# Patient Record
Sex: Male | Born: 1950 | Race: White | Hispanic: No | Smoking: Former smoker
Health system: Southern US, Community
[De-identification: ages and names within clinical notes are randomized; demographics above are authoritative.]

## PROBLEM LIST (undated history)

## (undated) DIAGNOSIS — E079 Disorder of thyroid, unspecified: Secondary | ICD-10-CM

## (undated) DIAGNOSIS — E785 Hyperlipidemia, unspecified: Secondary | ICD-10-CM

## (undated) DIAGNOSIS — C44300 Unspecified malignant neoplasm of skin of unspecified part of face: Secondary | ICD-10-CM

## (undated) DIAGNOSIS — Z86010 Personal history of colonic polyps: Secondary | ICD-10-CM

## (undated) HISTORY — PX: COLONOSCOPY: SHX174

## (undated) HISTORY — DX: Personal history of colonic polyps: Z86.010

## (undated) HISTORY — DX: Unspecified malignant neoplasm of skin of unspecified part of face: C44.300

## (undated) HISTORY — PX: OTHER SURGICAL HISTORY: SHX169

## (undated) HISTORY — DX: Disorder of thyroid, unspecified: E07.9

## (undated) HISTORY — PX: POLYPECTOMY: SHX149

## (undated) HISTORY — DX: Hyperlipidemia, unspecified: E78.5

## (undated) HISTORY — PX: HYDROCELE EXCISION / REPAIR: SUR1145

---

## 2008-06-12 ENCOUNTER — Ambulatory Visit: Payer: Self-pay | Admitting: Internal Medicine

## 2008-06-25 ENCOUNTER — Telehealth: Payer: Self-pay | Admitting: Internal Medicine

## 2008-06-26 ENCOUNTER — Ambulatory Visit: Payer: Self-pay | Admitting: Internal Medicine

## 2008-06-26 ENCOUNTER — Encounter: Payer: Self-pay | Admitting: Internal Medicine

## 2008-06-26 DIAGNOSIS — Z860100 Personal history of colon polyps, unspecified: Secondary | ICD-10-CM

## 2008-06-26 HISTORY — DX: Personal history of colonic polyps: Z86.010

## 2008-06-26 HISTORY — DX: Personal history of colon polyps, unspecified: Z86.0100

## 2008-07-03 ENCOUNTER — Encounter: Payer: Self-pay | Admitting: Internal Medicine

## 2011-09-28 ENCOUNTER — Other Ambulatory Visit: Payer: Self-pay | Admitting: Internal Medicine

## 2011-10-03 ENCOUNTER — Telehealth: Payer: Self-pay

## 2011-10-03 NOTE — Telephone Encounter (Signed)
PATIENT STATES WE GAVE HIM A 30 DAY SUPPLY  OF SYNTHROID AND HE DOES NOT KNOW WHY. HE SAID IT WAS PRESCRIBED BY RYAN DUNN AND HE DOES NOT EVEN KNOW RYAN. HE USUALLY GETS A 90 DAY SUPPLY FROM [facility] AND THAT HE HAS PLENTY. HE SAID HE HAS NOT EVEN HAD A PHYSICAL. BEST PHONE [phone]  (CELL)   MBC

## 2011-10-04 NOTE — Telephone Encounter (Signed)
LMOM that if he didn't need refill then dont get it filled.

## 2011-10-04 NOTE — Telephone Encounter (Signed)
Ryan, do you know anything about this prescription?

## 2011-10-04 NOTE — Telephone Encounter (Signed)
We received a refill request, so I refilled it. If he does not need it then he does not need to fill it.

## 2011-11-30 ENCOUNTER — Ambulatory Visit (INDEPENDENT_AMBULATORY_CARE_PROVIDER_SITE_OTHER): Payer: 59 | Admitting: Family Medicine

## 2011-11-30 ENCOUNTER — Encounter: Payer: Self-pay | Admitting: Family Medicine

## 2011-11-30 VITALS — BP 124/74 | HR 56 | Temp 98.2°F | Resp 16 | Ht 67.5 in | Wt 185.8 lb

## 2011-11-30 DIAGNOSIS — R9431 Abnormal electrocardiogram [ECG] [EKG]: Secondary | ICD-10-CM

## 2011-11-30 DIAGNOSIS — Z23 Encounter for immunization: Secondary | ICD-10-CM

## 2011-11-30 DIAGNOSIS — E039 Hypothyroidism, unspecified: Secondary | ICD-10-CM

## 2011-11-30 DIAGNOSIS — E663 Overweight: Secondary | ICD-10-CM

## 2011-11-30 DIAGNOSIS — I4510 Unspecified right bundle-branch block: Secondary | ICD-10-CM

## 2011-11-30 DIAGNOSIS — E785 Hyperlipidemia, unspecified: Secondary | ICD-10-CM

## 2011-11-30 DIAGNOSIS — Z87891 Personal history of nicotine dependence: Secondary | ICD-10-CM

## 2011-11-30 DIAGNOSIS — Z0000 Encounter for general adult medical examination without abnormal findings: Secondary | ICD-10-CM

## 2011-11-30 DIAGNOSIS — N400 Enlarged prostate without lower urinary tract symptoms: Secondary | ICD-10-CM

## 2011-11-30 LAB — POCT URINALYSIS DIPSTICK
Glucose, UA: NEGATIVE
Ketones, UA: NEGATIVE
Leukocytes, UA: NEGATIVE
Spec Grav, UA: 1.02

## 2011-11-30 MED ORDER — LEVOTHYROXINE SODIUM 100 MCG PO TABS: 100.0000 ug | ORAL_TABLET | Freq: Every day | ORAL | Status: DC

## 2011-11-30 MED ORDER — ATORVASTATIN CALCIUM 10 MG PO TABS: 10.0000 mg | ORAL_TABLET | Freq: Every day | ORAL | Status: DC

## 2011-11-30 NOTE — Patient Instructions (Signed)
Keeping you healthy  Get these tests  Blood pressure- Have your blood pressure checked once a year by your healthcare provider.  Normal blood pressure is 120/80  Weight- Have your body mass index (BMI) calculated to screen for obesity.  BMI is a measure of body fat based on height and weight. You can also calculate your own BMI at [email or web address].  Cholesterol- Have your cholesterol checked every year.  Diabetes- Have your blood sugar checked regularly if you have high blood pressure, high cholesterol, have a family history of diabetes or if you are overweight.  Screening for Colon Cancer- Colonoscopy starting at age 21.  Screening may begin sooner depending on your family history and other health conditions. Follow up colonoscopy as directed by your Gastroenterologist.  Screening for Prostate Cancer- Both blood work (PSA) and a rectal exam help screen for Prostate Cancer.  Screening begins at age 24 with African-American men and at age 87 with Caucasian men.  Screening may begin sooner depending on your family history.  Take these medicines  Aspirin- One aspirin daily can help prevent Heart disease and Stroke.  Flu shot- Every fall.  Tetanus- Every 10 years.  Tdap was given today.  Zostavax- Once after the age of 44 to prevent Shingles. You have a prescription for this, given to you last year.  Pneumonia shot- Once after the age of 72; if you are younger than 52, ask your healthcare provider if you need a Pneumonia shot. You received this vaccine in 2011.  Take these steps  Don't smoke- If you do smoke, talk to your doctor about quitting.  For tips on how to quit, go to www.smokefree.gov or call 1-800-QUIT-NOW.  Be physically active- Exercise 5 days a week for at least 30 minutes.  If you are not already physically active start slow and gradually work up to 30 minutes of moderate physical activity.  Examples of moderate activity include walking briskly, mowing the yard,  dancing, swimming, bicycling, etc.  Eat a healthy diet- Eat a variety of healthy food such as fruits, vegetables, low fat milk, low fat cheese, yogurt, lean meant, poultry, fish, beans, tofu, etc. For more information go to www.thenutritionsource.org  Drink alcohol in moderation- Limit alcohol intake to less than two drinks a day. Never drink and drive.  Dentist- Brush and floss twice daily; visit your dentist twice a year.  Depression- Your emotional health is as important as your physical health. If you're feeling down, or losing interest in things you would normally enjoy please talk to your healthcare provider.  Eye exam- Visit your eye doctor every year.  Safe sex- If you may be exposed to a sexually transmitted infection, use a condom.  Seat belts- Seat belts can save your life; always wear one.  Smoke/Carbon Monoxide detectors- These detectors need to be installed on the appropriate level of your home.  Replace batteries at least once a year.  Skin cancer- When out in the sun, cover up and use sunscreen 15 SPF or higher.  Violence- If anyone is threatening you, please tell your healthcare provider.  Living Will/ Health care power of attorney- Speak with your healthcare provider and family.   Hypertriglyceridemia  Diet for High blood levels of Triglycerides Most fats in food are triglycerides. Triglycerides in your blood are stored as fat in your body. High levels of triglycerides in your blood may put you at a greater risk for heart disease and stroke.  Normal triglyceride levels are less than 150 mg/dL. Borderline  high levels are 150-199 mg/dl. High levels are 200 - 499 mg/dL, and very high triglyceride levels are greater than 500 mg/dL. The decision to treat high triglycerides is generally based on the level. For people with borderline or high triglyceride levels, treatment includes weight loss and exercise. Drugs are recommended for people with very high triglyceride  levels. Many people who need treatment for high triglyceride levels have metabolic syndrome. This syndrome is a collection of disorders that often include: insulin resistance, high blood pressure, blood clotting problems, high cholesterol and triglycerides. TESTING PROCEDURE FOR TRIGLYCERIDES  You should not eat 4 hours before getting your triglycerides measured. The normal range of triglycerides is between 10 and 250 milligrams per deciliter (mg/dl). Some people may have extreme levels (1000 or above), but your triglyceride level may be too high if it is above 150 mg/dl, depending on what other risk factors you have for heart disease.   People with high blood triglycerides may also have high blood cholesterol levels. If you have high blood cholesterol as well as high blood triglycerides, your risk for heart disease is probably greater than if you only had high triglycerides. High blood cholesterol is one of the main risk factors for heart disease.  CHANGING YOUR DIET  Your weight can affect your blood triglyceride level. If you are more than 20% above your ideal body weight, you may be able to lower your blood triglycerides by losing weight. Eating less and exercising regularly is the best way to combat this. Fat provides more calories than any other food. The best way to lose weight is to eat less fat. Only 30% of your total calories should come from fat. Less than 7% of your diet should come from saturated fat. A diet low in fat and saturated fat is the same as a diet to decrease blood cholesterol. By eating a diet lower in fat, you may lose weight, lower your blood cholesterol, and lower your blood triglyceride level.  Eating a diet low in fat, especially saturated fat, may also help you lower your blood triglyceride level. Ask your dietitian to help you figure how much fat you can eat based on the number of calories your caregiver has prescribed for you.  Exercise, in addition to helping with weight  loss may also help lower triglyceride levels.   Alcohol can increase blood triglycerides. You may need to stop drinking alcoholic beverages.   Too much carbohydrate in your diet may also increase your blood triglycerides. Some complex carbohydrates are necessary in your diet. These may include bread, rice, potatoes, other starchy vegetables and cereals.   Reduce "simple" carbohydrates. These may include pure sugars, candy, honey, and jelly without losing other nutrients. If you have the kind of high blood triglycerides that is affected by the amount of carbohydrates in your diet, you will need to eat less sugar and less high-sugar foods. Your caregiver can help you with this.   Adding 2-4 grams of fish oil (EPA+ DHA) may also help lower triglycerides. Speak with your caregiver before adding any supplements to your regimen.  Following the Diet  Maintain your ideal weight. Your caregivers can help you with a diet. Generally, eating less food and getting more exercise will help you lose weight. Joining a weight control group may also help. Ask your caregivers for a good weight control group in your area.  Eat low-fat foods instead of high-fat foods. This can help you lose weight too.  These foods are lower in fat.  Eat MORE of these:   Dried beans, peas, and lentils.   Egg whites.   Low-fat cottage cheese.   Fish.   Lean cuts of meat, such as round, sirloin, rump, and flank (cut extra fat off meat you fix).   Whole grain breads, cereals and pasta.   Skim and nonfat dry milk.   Low-fat yogurt.   Poultry without the skin.   Cheese made with skim or part-skim milk, such as mozzarella, parmesan, farmers', ricotta, or pot cheese.  These are higher fat foods. Eat LESS of these:   Whole milk and foods made from whole milk, such as American, blue, cheddar, monterey jack, and swiss cheese   High-fat meats, such as luncheon meats, sausages, knockwurst, bratwurst, hot dogs, ribs, corned beef,  ground pork, and regular ground beef.   Fried foods.  Limit saturated fats in your diet. Substituting unsaturated fat for saturated fat may decrease your blood triglyceride level. You will need to read package labels to know which products contain saturated fats.  These foods are high in saturated fat. Eat LESS of these:   Fried pork skins.   Whole milk.   Skin and fat from poultry.   Palm oil.   Butter.   Shortening.   Cream cheese.   Tomasa Blase.   Margarines and baked goods made from listed oils.   Vegetable shortenings.   Chitterlings.   Fat from meats.   Coconut oil.   Palm kernel oil.   Lard.   Cream.   Sour cream.   Fatback.   Coffee whiteners and non-dairy creamers made with these oils.   Cheese made from whole milk.  Use unsaturated fats (both polyunsaturated and monounsaturated) moderately. Remember, even though unsaturated fats are better than saturated fats; you still want a diet low in total fat.  These foods are high in unsaturated fat:   Canola oil.   Sunflower oil.   Mayonnaise.   Almonds.   Peanuts.   Pine nuts.   Margarines made with these oils.   Safflower oil.   Olive oil.   Avocados.   Cashews.   Peanut butter.   Sunflower seeds.   Soybean oil.   Peanut oil.   Olives.   Pecans.   Walnuts.   Pumpkin seeds.  Avoid sugar and other high-sugar foods. This will decrease carbohydrates without decreasing other nutrients. Sugar in your food goes rapidly to your blood. When there is excess sugar in your blood, your liver may use it to make more triglycerides. Sugar also contains calories without other important nutrients.  Eat LESS of these:   Sugar, brown sugar, powdered sugar, jam, jelly, preserves, honey, syrup, molasses, pies, candy, cakes, cookies, frosting, pastries, colas, soft drinks, punches, fruit drinks, and regular gelatin.   Avoid alcohol. Alcohol, even more than sugar, may increase blood triglycerides. In  addition, alcohol is high in calories and low in nutrients. Ask for sparkling water, or a diet soft drink instead of an alcoholic beverage.  Suggestions for planning and preparing meals   Bake, broil, grill or roast meats instead of frying.   Remove fat from meats and skin from poultry before cooking.   Add spices, herbs, lemon juice or vinegar to vegetables instead of salt, rich sauces or gravies.   Use a non-stick skillet without fat or use no-stick sprays.   Cool and refrigerate stews and broth. Then remove the hardened fat floating on the surface before serving.   Refrigerate meat drippings and skim off fat to  make low-fat gravies.   Serve more fish.   Use less butter, margarine and other high-fat spreads on bread or vegetables.   Use skim or reconstituted non-fat dry milk for cooking.   Cook with low-fat cheeses.   Substitute low-fat yogurt or cottage cheese for all or part of the sour cream in recipes for sauces, dips or congealed salads.   Use half yogurt/half mayonnaise in salad recipes.   Substitute evaporated skim milk for cream. Evaporated skim milk or reconstituted non-fat dry milk can be whipped and substituted for whipped cream in certain recipes.   Choose fresh fruits for dessert instead of high-fat foods such as pies or cakes. Fruits are naturally low in fat.  When Dining Out   Order low-fat appetizers such as fruit or vegetable juice, pasta with vegetables or tomato sauce.   Select clear, rather than cream soups.   Ask that dressings and gravies be served on the side. Then use less of them.   Order foods that are baked, broiled, poached, steamed, stir-fried, or roasted.   Ask for margarine instead of butter, and use only a small amount.   Drink sparkling water, unsweetened tea or coffee, or diet soft drinks instead of alcohol or other sweet beverages.  QUESTIONS AND ANSWERS ABOUT OTHER FATS IN THE BLOOD: SATURATED FAT, TRANS FAT, AND CHOLESTEROL What is  trans fat? Trans fat is a type of fat that is formed when vegetable oil is hardened through a process called hydrogenation. This process helps makes foods more solid, gives them shape, and prolongs their shelf life. Trans fats are also called hydrogenated or partially hydrogenated oils.  What do saturated fat, trans fat, and cholesterol in foods have to do with heart disease? Saturated fat, trans fat, and cholesterol in the diet all raise the level of LDL "bad" cholesterol in the blood. The higher the LDL cholesterol, the greater the risk for coronary heart disease (CHD). Saturated fat and trans fat raise LDL similarly.  What foods contain saturated fat, trans fat, and cholesterol? High amounts of saturated fat are found in animal products, such as fatty cuts of meat, chicken skin, and full-fat dairy products like butter, whole milk, cream, and cheese, and in tropical vegetable oils such as palm, palm kernel, and coconut oil. Trans fat is found in some of the same foods as saturated fat, such as vegetable shortening, some margarines (especially hard or stick margarine), crackers, cookies, baked goods, fried foods, salad dressings, and other processed foods made with partially hydrogenated vegetable oils. Small amounts of trans fat also occur naturally in some animal products, such as milk products, beef, and lamb. Foods high in cholesterol include liver, other organ meats, egg yolks, shrimp, and full-fat dairy products. How can I use the new food label to make heart-healthy food choices? Check the Nutrition Facts panel of the food label. Choose foods lower in saturated fat, trans fat, and cholesterol. For saturated fat and cholesterol, you can also use the Percent Daily Value (%DV): 5% DV or less is low, and 20% DV or more is high. (There is no %DV for trans fat.) Use the Nutrition Facts panel to choose foods low in saturated fat and cholesterol, and if the trans fat is not listed, read the ingredients and  limit products that list shortening or hydrogenated or partially hydrogenated vegetable oil, which tend to be high in trans fat. POINTS TO REMEMBER: YOU NEED A LITTLE TLC (THERAPEUTIC LIFESTYLE CHANGES)  Discuss your risk for heart disease with  your caregivers, and take steps to reduce risk factors.   Change your diet. Choose foods that are low in saturated fat, trans fat, and cholesterol.   Add exercise to your daily routine if it is not already being done. Participate in physical activity of moderate intensity, like brisk walking, for at least 30 minutes on most, and preferably all days of the week. No time? Break the 30 minutes into three, 10-minute segments during the day.   Stop smoking. If you do smoke, contact your caregiver to discuss ways in which they can help you quit.   Do not use street drugs.   Maintain a normal weight.   Maintain a healthy blood pressure.   Keep up with your blood work for checking the fats in your blood as directed by your caregiver.  Document Released: 02/10/2004 Document Revised: 04/13/2011 Document Reviewed: 09/07/2008 [facility] Patient Information 2012 [place], [place].

## 2011-11-30 NOTE — Progress Notes (Deleted)
  Subjective:    Patient ID: Joshua Moreno, male    DOB: Aug 03, 1950, 61 y.o.   MRN: [id number]  HPI    Review of Systems  Constitutional: Negative.   Eyes: Negative.   Cardiovascular: Negative.   Gastrointestinal: Negative.   Genitourinary: Negative.   Musculoskeletal: Negative.   Skin: Negative.   Neurological: Negative.   Hematological: Negative.   Psychiatric/Behavioral: Negative.        Objective:   Physical Exam        Assessment & Plan:

## 2011-11-30 NOTE — Progress Notes (Signed)
Subjective:    Patient ID: Joshua Moreno, male    DOB: 1950/09/10, 61 y.o.   MRN: [id number]  HPI This 61 y.o. Male is here for CPE. He has Hypothyroidism diagnosed in 2004 and Lipid disorder  with high HDL and LDL. He has had elevated PSA in past with evaluation with Dr. Annabell Howells; the elevated  PSA was thought to be lab error and repeat value was normal (2010  His last PSA = 1.68 was checked in July 2012    He works in [employer] and is a nonsmoker. He does consume some alcohol 2-3x/week.Exercise: Tennis  twice a week.   Last Colonoscopy: Feb 2011   Review of Systems  Constitutional: Negative.   Eyes: Negative.   Respiratory: Negative.   Cardiovascular: Negative.   Gastrointestinal: Negative.   Genitourinary: Negative.   Musculoskeletal: Negative.   Skin: Negative.   Hematological: Negative.   Psychiatric/Behavioral: Negative.        Objective:   Physical Exam  Nursing note and vitals reviewed. Constitutional: He is oriented to person, place, and time. He appears well-developed and well-nourished. No distress.  HENT:  Head: Normocephalic and atraumatic.  Right Ear: Hearing, tympanic membrane, external ear and ear canal normal.  Left Ear: Hearing, tympanic membrane, external ear and ear canal normal.  Nose: Nose normal. No mucosal edema, nasal deformity or septal deviation.  Mouth/Throat: Uvula is midline, oropharynx is clear and moist and mucous membranes are normal. No oral lesions. Normal dentition. No dental caries.  Eyes: Conjunctivae and EOM are normal. Pupils are equal, round, and reactive to light. No scleral icterus.  Neck: Normal range of motion. Neck supple. No thyromegaly present.  Cardiovascular: Normal rate, regular rhythm, normal heart sounds and intact distal pulses.  Exam reveals no gallop and no friction rub.   No murmur heard. Pulmonary/Chest: Effort normal and breath sounds normal. No respiratory distress. He has no wheezes.       Chest size increased , slight  barrel shape- breath sounds distant at bases  Abdominal: Soft. Normal appearance and bowel sounds are normal. He exhibits no abdominal bruit, no pulsatile midline mass and no mass. There is no hepatosplenomegaly. There is no tenderness. There is no guarding and no CVA tenderness. No hernia. Hernia confirmed negative in the right inguinal area and confirmed negative in the left inguinal area.  Genitourinary: Prostate normal, testes normal and penis normal. Rectal exam shows external hemorrhoid. Rectal exam shows no fissure, no mass, no tenderness and anal tone normal. Guaiac negative stool. Prostate is not tender. Right testis shows no mass, no swelling and no tenderness. Right testis is descended. Left testis shows no mass, no swelling and no tenderness. Left testis is descended.       Prostate firm with slightly enlarged right lobe but no discrete nodules  Musculoskeletal: Normal range of motion. He exhibits no edema and no tenderness.  Lymphadenopathy:    He has no cervical adenopathy.       Right: No inguinal adenopathy present.       Left: No inguinal adenopathy present.  Neurological: He is alert and oriented to person, place, and time. No cranial nerve deficit. He exhibits normal muscle tone. Coordination normal.       DTRs diminished- 1+  Skin: Skin is warm and dry. No erythema. No pallor.  Psychiatric: He has a normal mood and affect. His behavior is normal. Judgment and thought content normal.          Assessment & Plan:  1. Routine general medical examination at a health care facility  PSA, IFOBT POC (occult bld, rslt in office), POCT urinalysis dipstick  2. Hyperlipidemia  Lipid panel, Comprehensive metabolic panel RF: Atorvastatin 10 mg  #90  3 RFs  3. Hypothyroidism  TSH RF: Levothyroxine  100 mcg  #90  3 RFs  4. Abnormal resting ECG findings - ETT Cardiolyte- 2004- normal , EF 60% ECG: 2011 and 2012 - Sinus bradycardia and RBBB Pt asymptomatic  5. Need for prophylactic  vaccination with combined diphtheria-tetanus-pertussis (DTP) vaccine  Tdap vaccine greater than or equal to 7yo IM  6. RBBB -chronic and stable

## 2011-12-01 LAB — COMPREHENSIVE METABOLIC PANEL
ALT: 38 U/L (ref 0–53)
CO2: 27 mEq/L (ref 19–32)
Calcium: 9.6 mg/dL (ref 8.4–10.5)
Chloride: 105 mEq/L (ref 96–112)
Glucose, Bld: 74 mg/dL (ref 70–99)
Sodium: 142 mEq/L (ref 135–145)
Total Bilirubin: 1.1 mg/dL (ref 0.3–1.2)
Total Protein: 6.8 g/dL (ref 6.0–8.3)

## 2011-12-01 LAB — TSH: TSH: 1.738 u[IU]/mL (ref 0.350–4.500)

## 2011-12-01 LAB — LIPID PANEL
HDL: 57 mg/dL (ref >39)
LDL Cholesterol: 99 mg/dL (ref 0–99)
Triglycerides: 57 mg/dL (ref <150)

## 2011-12-01 LAB — PSA: PSA: 1.2 ng/mL (ref <=4.00)

## 2011-12-04 ENCOUNTER — Encounter: Payer: Self-pay | Admitting: Family Medicine

## 2011-12-04 DIAGNOSIS — N400 Enlarged prostate without lower urinary tract symptoms: Secondary | ICD-10-CM | POA: Insufficient documentation

## 2011-12-04 DIAGNOSIS — I4510 Unspecified right bundle-branch block: Secondary | ICD-10-CM | POA: Insufficient documentation

## 2011-12-04 DIAGNOSIS — E663 Overweight: Secondary | ICD-10-CM | POA: Insufficient documentation

## 2011-12-04 DIAGNOSIS — E039 Hypothyroidism, unspecified: Secondary | ICD-10-CM | POA: Insufficient documentation

## 2011-12-04 DIAGNOSIS — Z87891 Personal history of nicotine dependence: Secondary | ICD-10-CM | POA: Insufficient documentation

## 2011-12-04 DIAGNOSIS — E785 Hyperlipidemia, unspecified: Secondary | ICD-10-CM | POA: Insufficient documentation

## 2011-12-05 ENCOUNTER — Encounter: Payer: Self-pay | Admitting: Family Medicine

## 2011-12-05 NOTE — Progress Notes (Signed)
Quick Note:  Please notify pt that results are normal.   Provide pt with copy of labs. ______ 

## 2012-12-17 ENCOUNTER — Ambulatory Visit (INDEPENDENT_AMBULATORY_CARE_PROVIDER_SITE_OTHER): Payer: 59 | Admitting: Family Medicine

## 2012-12-17 VITALS — BP 126/76 | HR 54 | Temp 97.8°F | Resp 16 | Ht 68.4 in | Wt 197.4 lb

## 2012-12-17 DIAGNOSIS — B360 Pityriasis versicolor: Secondary | ICD-10-CM

## 2012-12-17 DIAGNOSIS — Z0000 Encounter for general adult medical examination without abnormal findings: Secondary | ICD-10-CM

## 2012-12-17 DIAGNOSIS — E785 Hyperlipidemia, unspecified: Secondary | ICD-10-CM

## 2012-12-17 DIAGNOSIS — E039 Hypothyroidism, unspecified: Secondary | ICD-10-CM

## 2012-12-17 LAB — POCT URINALYSIS DIPSTICK
Blood, UA: NEGATIVE
Glucose, UA: NEGATIVE
Ketones, UA: NEGATIVE
Protein, UA: NEGATIVE
Spec Grav, UA: 1.015
Urobilinogen, UA: 0.2

## 2012-12-17 LAB — LIPID PANEL
Cholesterol: 181 mg/dL (ref 0–200)
LDL Cholesterol: 104 mg/dL — ABNORMAL HIGH (ref 0–99)
Total CHOL/HDL Ratio: 3.1 Ratio
Triglycerides: 90 mg/dL (ref <150)
VLDL: 18 mg/dL (ref 0–40)

## 2012-12-17 LAB — POCT CBC
Lymph, poc: 2 (ref 0.6–3.4)
MCH, POC: 32.3 pg — AB (ref 27–31.2)
MCHC: 32.2 g/dL (ref 31.8–35.4)
MCV: 100.5 fL — AB (ref 80–97)
MID (cbc): 0.6 (ref 0–0.9)
MPV: 8.1 fL (ref 0–99.8)
POC MID %: 6.6 %M (ref 0–12)
Platelet Count, POC: 276 10*3/uL (ref 142–424)
WBC: 9.4 10*3/uL (ref 4.6–10.2)

## 2012-12-17 LAB — COMPREHENSIVE METABOLIC PANEL
ALT: 35 U/L (ref 0–53)
AST: 27 U/L (ref 0–37)
Alkaline Phosphatase: 75 U/L (ref 39–117)
BUN: 14 mg/dL (ref 6–23)
Chloride: 104 mEq/L (ref 96–112)
Creat: 1.15 mg/dL (ref 0.50–1.35)

## 2012-12-17 MED ORDER — LEVOTHYROXINE SODIUM 100 MCG PO TABS: 100.0000 ug | ORAL_TABLET | Freq: Every day | ORAL | Status: DC

## 2012-12-17 MED ORDER — SELENIUM SULFIDE 2.5 % EX LOTN: TOPICAL_LOTION | Freq: Every day | CUTANEOUS | Status: DC | PRN

## 2012-12-17 MED ORDER — ATORVASTATIN CALCIUM 10 MG PO TABS: 10.0000 mg | ORAL_TABLET | Freq: Every day | ORAL | Status: DC

## 2012-12-17 NOTE — Progress Notes (Signed)
Subjective:    Patient ID: Joshua Moreno, male    DOB: July 04, 1950, 62 y.o.   MRN: [id number] Chief Complaint  Patient presents with  . Annual Exam    make sure rx's are for 90 day because of mail order    HPI  Doing well.  Is fasting today.  Has never gotten shingles vaccine.  Review of Systems  Musculoskeletal: Positive for arthralgias.  Skin: Positive for rash.  All other systems reviewed and are negative.      BP 126/76  Pulse 54  Temp(Src) 97.8 F (36.6 C) (Oral)  Resp 16  Ht 5' 8.4" (1.737 m)  Wt 197 lb 6.4 oz (89.54 kg)  BMI 29.68 kg/m2  SpO2 98% Objective:   Physical Exam  Constitutional: He is oriented to person, place, and time. He appears well-developed and well-nourished. No distress.  HENT:  Head: Normocephalic and atraumatic.  Right Ear: Tympanic membrane, external ear and ear canal normal.  Left Ear: Tympanic membrane, external ear and ear canal normal.  Nose: Nose normal.  Mouth/Throat: Uvula is midline, oropharynx is clear and moist and mucous membranes are normal. No oropharyngeal exudate.  Eyes: Conjunctivae are normal. Right eye exhibits no discharge. Left eye exhibits no discharge. No scleral icterus.  Neck: Normal range of motion. Neck supple. No thyromegaly present.  Cardiovascular: Normal rate, regular rhythm, normal heart sounds and intact distal pulses.   Pulmonary/Chest: Effort normal and breath sounds normal. No respiratory distress.  Abdominal: Soft. Bowel sounds are normal. He exhibits no distension and no mass. There is no tenderness. There is no rebound and no guarding.  Musculoskeletal: He exhibits no edema.  Lymphadenopathy:    He has no cervical adenopathy.  Neurological: He is alert and oriented to person, place, and time. He has normal reflexes. No cranial nerve deficit. He exhibits normal muscle tone.  Skin: Skin is warm and dry. Rash noted. Rash is macular. He is not diaphoretic. No erythema.  Hyperpigmented patches over back -  some with slight scale.  Psychiatric: He has a normal mood and affect. His behavior is normal.          Assessment & Plan:   Hypothyroidism - Plan: POCT CBC, IFOBT POC (occult bld, rslt in office), POCT urinalysis dipstick, Lipid panel, Comprehensive metabolic panel, TSH, PSA  Hyperlipidemia - Plan: POCT CBC, IFOBT POC (occult bld, rslt in office), POCT urinalysis dipstick, Lipid panel, Comprehensive metabolic panel, TSH, PSA  Routine general medical examination at a health care facility - Plan: POCT CBC, IFOBT POC (occult bld, rslt in office), POCT urinalysis dipstick, Lipid panel, Comprehensive metabolic panel, TSH, PSA - pt reminded that he will be due for colonoscopy Feb 2015 and needs to get shingles vaccine.  Tinea versicolor - try topical selsun blue - pt requests rx though avail otc as well  Meds ordered this encounter  Medications  . atorvastatin (LIPITOR) 10 MG tablet    Sig: Take 1 tablet (10 mg total) by mouth daily.    Dispense:  90 tablet    Refill:  3  . levothyroxine (SYNTHROID, LEVOTHROID) 100 MCG tablet    Sig: Take 1 tablet (100 mcg total) by mouth daily. NEEDS OFFICE VISIT FOR MORE    Dispense:  90 tablet    Refill:  3  . selenium sulfide (SELSUN) 2.5 % shampoo    Sig: Apply topically daily as needed for itching.    Dispense:  118 mL    Refill:  12    Carley Hammed  Brigitte Pulse, MD MPH

## 2012-12-17 NOTE — Patient Instructions (Addendum)
You will be due for you colonoscopy in February of 2015.  You should get your shingles vaccine next time you are at your pharmacy.  Keeping you healthy  Get these tests  Blood pressure- Have your blood pressure checked once a year by your healthcare provider.  Normal blood pressure is 120/80  Weight- Have your body mass index (BMI) calculated to screen for obesity.  BMI is a measure of body fat based on height and weight. You can also calculate your own BMI at [email or web address].  Cholesterol- Have your cholesterol checked every year.  Diabetes- Have your blood sugar checked regularly if you have high blood pressure, high cholesterol, have a family history of diabetes or if you are overweight.  Screening for Colon Cancer- Colonoscopy starting at age 58.  Screening may begin sooner depending on your family history and other health conditions. Follow up colonoscopy as directed by your Gastroenterologist.  Screening for Prostate Cancer- Both blood work (PSA) and a rectal exam help screen for Prostate Cancer.  Screening begins at age 39 with African-American men and at age 23 with Caucasian men.  Screening may begin sooner depending on your family history.  Take these medicines  Aspirin- One aspirin daily can help prevent Heart disease and Stroke.  Flu shot- Every fall.  Tetanus- Every 10 years.  Zostavax- Once after the age of 10 to prevent Shingles.  Pneumonia shot- Once after the age of 62; if you are younger than 59, ask your healthcare provider if you need a Pneumonia shot.  Take these steps  Don't smoke- If you do smoke, talk to your doctor about quitting.  For tips on how to quit, go to www.smokefree.gov or call 1-800-QUIT-NOW.  Be physically active- Exercise 5 days a week for at least 30 minutes.  If you are not already physically active start slow and gradually work up to 30 minutes of moderate physical activity.  Examples of moderate activity include walking briskly,  mowing the yard, dancing, swimming, bicycling, etc.  Eat a healthy diet- Eat a variety of healthy food such as fruits, vegetables, low fat milk, low fat cheese, yogurt, lean meant, poultry, fish, beans, tofu, etc. For more information go to www.thenutritionsource.org  Drink alcohol in moderation- Limit alcohol intake to less than two drinks a day. Never drink and drive.  Dentist- Brush and floss twice daily; visit your dentist twice a year.  Depression- Your emotional health is as important as your physical health. If you're feeling down, or losing interest in things you would normally enjoy please talk to your healthcare provider.  Eye exam- Visit your eye doctor every year.  Safe sex- If you may be exposed to a sexually transmitted infection, use a condom.  Seat belts- Seat belts can save your life; always wear one.  Smoke/Carbon Monoxide detectors- These detectors need to be installed on the appropriate level of your home.  Replace batteries at least once a year.  Skin cancer- When out in the sun, cover up and use sunscreen 15 SPF or higher.  Violence- If anyone is threatening you, please tell your healthcare provider.  Living Will/ Health care power of attorney- Speak with your healthcare provider and family.

## 2012-12-18 ENCOUNTER — Encounter: Payer: Self-pay | Admitting: Family Medicine

## 2012-12-26 ENCOUNTER — Telehealth: Payer: Self-pay

## 2012-12-26 NOTE — Telephone Encounter (Signed)
Pt is needing a refill of a fungus cream sent to his home address  Best number [phone]

## 2012-12-26 NOTE — Telephone Encounter (Signed)
I do not see where we have written for an antifungal cream since go live in epic. We have written for [facility] recently. I will forward to treating MD for review.

## 2012-12-27 ENCOUNTER — Telehealth: Payer: Self-pay

## 2012-12-27 MED ORDER — SELENIUM SULFIDE 2.5 % EX LOTN: TOPICAL_LOTION | Freq: Every day | CUTANEOUS | Status: DC | PRN

## 2012-12-27 NOTE — Telephone Encounter (Signed)
Pt would like lotion. Ordered and sent to [facility]

## 2012-12-27 NOTE — Telephone Encounter (Signed)
Pt did not have lab questions. See other phone message regarding rx

## 2012-12-27 NOTE — Telephone Encounter (Signed)
Does he mean the selenium sulfide 2.5% shampoo body wash that was rx'ed?  It had 12 refills on it. . . Or does he want something else?

## 2012-12-27 NOTE — Telephone Encounter (Signed)
Patient wants a call back to get his lab results and Rx's. Clinical TL was at lunch and no one was available at the moment. Patient understood.   Best: [phone]

## 2012-12-27 NOTE — Telephone Encounter (Signed)
LMOM to CB to let us know whether he wants cream or shampoo/body wash

## 2012-12-27 NOTE — Telephone Encounter (Signed)
Looks like the selsun blue was sent to [facility] and when he called them they said they didn't have anything. Pt says that he thought Dr. Clelia Croft was giving him a cream, not a shampoo. Please advise and send to [facility]. Thanks s

## 2012-12-27 NOTE — Telephone Encounter (Signed)
°    Patient wants a call back to get his lab results and Rx's. Clinical TL was at lunch and no one was available at the moment. Patient understood.  Best: [phone]

## 2012-12-27 NOTE — Telephone Encounter (Signed)
The selenium sulfide 2.25% is used as a body wash - even though it is labeled shampoo - to be used daily for at least 1-2 wks.  If he would prefer a lotion, selenium sulfide does come in a 2.5% lotion - use the same way - apply ALL over neck to toes, daily for at least 1-2 weeks.  Whatever he prefers is fine. With 12 refills. Thanks.

## 2013-03-27 ENCOUNTER — Other Ambulatory Visit: Payer: Self-pay

## 2013-03-27 MED ORDER — LEVOTHYROXINE SODIUM 100 MCG PO TABS: 100.0000 ug | ORAL_TABLET | Freq: Every day | ORAL | Status: DC

## 2013-06-15 ENCOUNTER — Encounter: Payer: Self-pay | Admitting: Internal Medicine

## 2013-06-23 ENCOUNTER — Encounter: Payer: Self-pay | Admitting: Internal Medicine

## 2013-11-20 ENCOUNTER — Encounter: Payer: Self-pay | Admitting: Internal Medicine

## 2013-12-29 ENCOUNTER — Ambulatory Visit (INDEPENDENT_AMBULATORY_CARE_PROVIDER_SITE_OTHER): Payer: 59 | Admitting: Internal Medicine

## 2013-12-29 ENCOUNTER — Ambulatory Visit (INDEPENDENT_AMBULATORY_CARE_PROVIDER_SITE_OTHER): Payer: 59

## 2013-12-29 VITALS — BP 126/82 | HR 63 | Temp 98.2°F | Resp 18 | Ht 68.0 in | Wt 192.0 lb

## 2013-12-29 DIAGNOSIS — E039 Hypothyroidism, unspecified: Secondary | ICD-10-CM

## 2013-12-29 DIAGNOSIS — M545 Low back pain, unspecified: Secondary | ICD-10-CM

## 2013-12-29 DIAGNOSIS — Z0000 Encounter for general adult medical examination without abnormal findings: Secondary | ICD-10-CM

## 2013-12-29 DIAGNOSIS — E785 Hyperlipidemia, unspecified: Secondary | ICD-10-CM

## 2013-12-29 LAB — LIPID PANEL
Cholesterol: 253 mg/dL — ABNORMAL HIGH (ref 0–200)
HDL: 70 mg/dL (ref >39)
LDL Cholesterol: 165 mg/dL — ABNORMAL HIGH (ref 0–99)
TRIGLYCERIDES: 92 mg/dL (ref <150)
Total CHOL/HDL Ratio: 3.6 Ratio
VLDL: 18 mg/dL (ref 0–40)

## 2013-12-29 LAB — POCT UA - MICROSCOPIC ONLY
BACTERIA, U MICROSCOPIC: NEGATIVE
CASTS, UR, LPF, POC: NEGATIVE
CRYSTALS, UR, HPF, POC: NEGATIVE
RBC, urine, microscopic: NEGATIVE
WBC, Ur, HPF, POC: NEGATIVE
Yeast, UA: NEGATIVE

## 2013-12-29 LAB — POCT URINALYSIS DIPSTICK
Bilirubin, UA: NEGATIVE
Glucose, UA: NEGATIVE
KETONES UA: NEGATIVE
Leukocytes, UA: NEGATIVE
Nitrite, UA: NEGATIVE
PH UA: 5
PROTEIN UA: NEGATIVE
RBC UA: NEGATIVE
SPEC GRAV UA: 1.02
UROBILINOGEN UA: 0.2

## 2013-12-29 LAB — POCT CBC
Granulocyte percent: 77.5 %G (ref 37–80)
HEMATOCRIT: 46.5 % (ref 43.5–53.7)
Hemoglobin: 15.5 g/dL (ref 14.1–18.1)
Lymph, poc: 1.7 (ref 0.6–3.4)
MCH: 31.7 pg — AB (ref 27–31.2)
MCHC: 33.3 g/dL (ref 31.8–35.4)
MCV: 95.4 fL (ref 80–97)
MID (CBC): 0.8 (ref 0–0.9)
MPV: 6.9 fL (ref 0–99.8)
PLATELET COUNT, POC: 275 10*3/uL (ref 142–424)
POC Granulocyte: 8.6 — AB (ref 2–6.9)
POC LYMPH PERCENT: 15.2 %L (ref 10–50)
POC MID %: 7.3 %M (ref 0–12)
RBC: 4.88 M/uL (ref 4.69–6.13)
RDW, POC: 13.2 %
WBC: 11.1 10*3/uL — AB (ref 4.6–10.2)

## 2013-12-29 LAB — TSH: TSH: 1.483 u[IU]/mL (ref 0.350–4.500)

## 2013-12-29 LAB — COMPREHENSIVE METABOLIC PANEL
ALT: 34 U/L (ref 0–53)
AST: 29 U/L (ref 0–37)
Albumin: 4.2 g/dL (ref 3.5–5.2)
Alkaline Phosphatase: 71 U/L (ref 39–117)
BILIRUBIN TOTAL: 0.6 mg/dL (ref 0.2–1.2)
BUN: 19 mg/dL (ref 6–23)
CHLORIDE: 106 meq/L (ref 96–112)
CO2: 29 meq/L (ref 19–32)
CREATININE: 1.18 mg/dL (ref 0.50–1.35)
Calcium: 9.6 mg/dL (ref 8.4–10.5)
GLUCOSE: 114 mg/dL — AB (ref 70–99)
Potassium: 4.6 mEq/L (ref 3.5–5.3)
SODIUM: 143 meq/L (ref 135–145)
TOTAL PROTEIN: 6.8 g/dL (ref 6.0–8.3)

## 2013-12-29 MED ORDER — ATORVASTATIN CALCIUM 10 MG PO TABS
10.0000 mg | ORAL_TABLET | Freq: Every day | ORAL | Status: DC
Start: 2013-12-29 — End: 2014-12-29

## 2013-12-29 MED ORDER — ATORVASTATIN CALCIUM 10 MG PO TABS: 10.0000 mg | ORAL_TABLET | Freq: Every day | ORAL | Status: DC

## 2013-12-29 MED ORDER — LEVOTHYROXINE SODIUM 100 MCG PO TABS: 100.0000 ug | ORAL_TABLET | Freq: Every day | ORAL | Status: DC

## 2013-12-29 NOTE — Progress Notes (Signed)
Subjective:    Patient ID: Joshua Moreno, male    DOB: [birth year], 63 y.o.   MRN: [id number]  HPI Here for CPE, has hypothyroid, LBP,  Exercises regularly. 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 Lumbar LBP, no radiation, weakness, numbness, or incontinence.   Review of Systems  Constitutional: Negative.   HENT: Negative.   Eyes: Negative.   Respiratory: Negative.   Gastrointestinal: Negative.   Endocrine: Negative.   Genitourinary: Negative.   Musculoskeletal: Positive for back pain.  Skin: Negative.   Allergic/Immunologic: Negative.   Neurological: Negative.   Hematological: Negative.   Psychiatric/Behavioral: Negative.        Objective:   Physical Exam  Constitutional: He is oriented to person, place, and time. He appears well-developed and well-nourished. No distress.  HENT:  Head: Normocephalic and atraumatic.  Right Ear: External ear normal.  Left Ear: External ear normal.  Mouth/Throat: Oropharynx is clear and moist.  Eyes: Conjunctivae and EOM are normal. Pupils are equal, round, and reactive to light.  Neck: Normal range of motion. Neck supple. No tracheal deviation present. No thyromegaly present.  Cardiovascular: Normal rate, normal heart sounds and intact distal pulses.   Pulmonary/Chest: Effort normal and breath sounds normal.  Abdominal: Soft. Bowel sounds are normal. There is no tenderness.  Musculoskeletal: He exhibits tenderness.       Lumbar back: He exhibits decreased range of motion, tenderness, pain and spasm. He exhibits no bony tenderness, no swelling, no edema, no deformity, no laceration and normal pulse.  Lymphadenopathy:    He has no cervical adenopathy.  Neurological: He is alert and oriented to person, place, and time. No cranial nerve deficit. He exhibits normal muscle tone. Coordination normal.  Skin: No rash noted.  Psychiatric: He has a normal mood and affect. His behavior is normal. Judgment and thought content normal.    EKG  Results for orders placed in visit on 12/29/13  POCT CBC      Result Value Ref Range   WBC 11.1 (*) 4.6 - 10.2 K/uL   Lymph, poc 1.7  0.6 - 3.4   POC LYMPH PERCENT 15.2  10 - 50 %L   MID (cbc) 0.8  0 - 0.9   POC MID % 7.3  0 - 12 %M   POC Granulocyte 8.6 (*) 2 - 6.9   Granulocyte percent 77.5  37 - 80 %G   RBC 4.88  4.69 - 6.13 M/uL   Hemoglobin 15.5  14.1 - 18.1 g/dL   HCT, POC 46.5  43.5 - 53.7 %   MCV 95.4  80 - 97 fL   MCH, POC 31.7 (*) 27 - 31.2 pg   MCHC 33.3  31.8 - 35.4 g/dL   RDW, POC 13.2     Platelet Count, POC 275  142 - 424 K/uL   MPV 6.9  0 - 99.8 fL  POCT UA - MICROSCOPIC ONLY      Result Value Ref Range   WBC, Ur, HPF, POC neg     RBC, urine, microscopic neg     Bacteria, U Microscopic neg     Mucus, UA trace     Epithelial cells, urine per micros 0-1     Crystals, Ur, HPF, POC neg     Casts, Ur, LPF, POC neg     Yeast, UA neg    POCT URINALYSIS DIPSTICK      Result Value Ref Range   Color, UA yellow     Clarity, UA  clear     Glucose, UA neg     Bilirubin, UA neg     Ketones, UA neg     Spec Grav, UA 1.020     Blood, UA neg     pH, UA 5.0     Protein, UA neg     Urobilinogen, UA 0.2     Nitrite, UA neg     Leukocytes, UA Negative     [facility] reading (PRIMARY) by  Dr Aliene Beams DJD changes only         Assessment & Plan:  CPE LBP Hypothyroid/hyperlidiemia RF meds 1 yr

## 2013-12-29 NOTE — Patient Instructions (Signed)

## 2013-12-30 LAB — PSA: PSA: 1.38 ng/mL (ref <=4.00)

## 2014-01-04 ENCOUNTER — Encounter: Payer: Self-pay | Admitting: *Deleted

## 2014-02-16 ENCOUNTER — Encounter: Payer: Self-pay | Admitting: Internal Medicine

## 2014-04-10 ENCOUNTER — Ambulatory Visit (AMBULATORY_SURGERY_CENTER): Payer: Self-pay | Admitting: *Deleted

## 2014-04-10 VITALS — Ht 68.0 in | Wt 195.2 lb

## 2014-04-10 DIAGNOSIS — Z86010 Personal history of colonic polyps: Secondary | ICD-10-CM

## 2014-04-10 NOTE — Progress Notes (Signed)
Denies allergies to eggs or soy products. Denies complications with sedation or anesthesia. Denies O2 use. Denies use of diet or weight loss medications.  Unable to give Emmi instructions for colonoscopy, internet downtime.

## 2014-04-23 ENCOUNTER — Encounter: Payer: Self-pay | Admitting: Internal Medicine

## 2014-04-28 ENCOUNTER — Encounter: Payer: Self-pay | Admitting: Internal Medicine

## 2014-05-28 ENCOUNTER — Encounter: Payer: Self-pay | Admitting: Internal Medicine

## 2014-06-04 ENCOUNTER — Ambulatory Visit (AMBULATORY_SURGERY_CENTER): Payer: 59 | Admitting: Internal Medicine

## 2014-06-04 ENCOUNTER — Encounter: Payer: Self-pay | Admitting: Internal Medicine

## 2014-06-04 VITALS — BP 129/70 | HR 44 | Temp 98.6°F | Resp 12 | Ht 68.0 in | Wt 195.0 lb

## 2014-06-04 DIAGNOSIS — Z860100 Personal history of colon polyps, unspecified: Secondary | ICD-10-CM

## 2014-06-04 DIAGNOSIS — D123 Benign neoplasm of transverse colon: Secondary | ICD-10-CM

## 2014-06-04 DIAGNOSIS — D124 Benign neoplasm of descending colon: Secondary | ICD-10-CM

## 2014-06-04 MED ORDER — SODIUM CHLORIDE 0.9 % IV SOLN: 500.0000 mL | INTRAVENOUS | Status: DC

## 2014-06-04 NOTE — Patient Instructions (Addendum)
I found and removed 3 polyps. One polyp was large and was almost completely removed but a part of it I could only cauterize.  It will need a follow-up exam in 3-6 months most likely.  Please avoid aspirin, ibuprofen, aleve, similar medications until Jun 18, 2014.  I appreciate the opportunity to care for you. Gatha Mayer, MD, FACG  YOU HAD AN ENDOSCOPIC PROCEDURE TODAY AT [facility]: Refer to the procedure report that was given to you for any specific questions about what was found during the examination.  If the procedure report does not answer your questions, please call your gastroenterologist to clarify.  If you requested that your care partner not be given the details of your procedure findings, then the procedure report has been included in a sealed envelope for you to review at your convenience later.  YOU SHOULD EXPECT: Some feelings of bloating in the abdomen. Passage of more gas than usual.  Walking can help get rid of the air that was put into your GI tract during the procedure and reduce the bloating. If you had a lower endoscopy (such as a colonoscopy or flexible sigmoidoscopy) you may notice spotting of blood in your stool or on the toilet paper. If you underwent a bowel prep for your procedure, then you may not have a normal bowel movement for a few days.  DIET: Your first meal following the procedure should be a light meal and then it is ok to progress to your normal diet.  A half-sandwich or bowl of soup is an example of a good first meal.  Heavy or fried foods are harder to digest and may make you feel nauseous or bloated.  Likewise meals heavy in dairy and vegetables can cause extra gas to form and this can also increase the bloating.  Drink plenty of fluids but you should avoid alcoholic beverages for 24 hours.  ACTIVITY: Your care partner should take you home directly after the procedure.  You should plan to take it easy, moving slowly for the rest  of the day.  You can resume normal activity the day after the procedure however you should NOT DRIVE or use heavy machinery for 24 hours (because of the sedation medicines used during the test).    SYMPTOMS TO REPORT IMMEDIATELY: A gastroenterologist can be reached at any hour.  During normal business hours, 8:30 AM to 5:00 PM Monday through Friday, call [phone].  After hours and on weekends, please call the GI answering service at [phone] who will take a message and have the physician on call contact you.   Following lower endoscopy (colonoscopy or flexible sigmoidoscopy):  Excessive amounts of blood in the stool  Significant tenderness or worsening of abdominal pains  Swelling of the abdomen that is new, acute  Fever of 100F or higher  FOLLOW UP: If any biopsies were taken you will be contacted by phone or by letter within the next 1-3 weeks.  Call your gastroenterologist if you have not heard about the biopsies in 3 weeks.  Our staff will call the home number listed on your records the next business day following your procedure to check on you and address any questions or concerns that you may have at that time regarding the information given to you following your procedure. This is a courtesy call and so if there is no answer at the home number and we have not heard from you through the emergency physician on call,  we will assume that you have returned to your regular daily activities without incident.  SIGNATURES/CONFIDENTIALITY: You and/or your care partner have signed paperwork which will be entered into your electronic medical record.  These signatures attest to the fact that that the information above on your After Visit Summary has been reviewed and is understood.  Full responsibility of the confidentiality of this discharge information lies with you and/or your care-partner.  Polyps-handout given

## 2014-06-04 NOTE — Op Note (Signed)
[facility]  [address] [place], [id number]   COLONOSCOPY PROCEDURE REPORT  PATIENT: Joshua Moreno, Joshua Moreno  MR#: [id number] BIRTHDATE: 1950/07/10 , 64  yrs. old GENDER: male ENDOSCOPIST: Gatha Mayer, MD, [facility] PROCEDURE DATE:  06/04/2014 PROCEDURE:   Colonoscopy with snare polypectomy and Submucosal injection, any substance First Screening Colonoscopy - Avg.  risk and is 50 yrs.  old or older - No.  Prior Negative Screening - Now for repeat screening. N/A  History of Adenoma - Now for follow-up colonoscopy & has been > or = to 3 yrs.  Yes hx of adenoma.  Has been 3 or more years since last colonoscopy.  Polyps Removed Today? Yes. ASA CLASS:   Class II INDICATIONS:surveillance colonoscopy based on a history of adenomatous colonic polyp(s). MEDICATIONS: Propofol 350 mg IV and Monitored anesthesia care  DESCRIPTION OF PROCEDURE:   After the risks benefits and alternatives of the procedure were thoroughly explained, informed consent was obtained.  The digital rectal exam revealed no abnormalities of the rectum.   The LB [id number]  endoscope was introduced through the anus and advanced to the cecum, which was identified by both the appendix and ileocecal valve. No adverse events experienced.   The quality of the prep was excellent, using MiraLax  The instrument was then slowly withdrawn as the colon was fully examined.      COLON FINDINGS: 1) Large flat-sessile polyp in descending colon. Half-circumferenmce on a fold.  Saline lift/EMR technique used to remove 90% of polyp and sent to pathology.  10% of polyp ablated with tip cautery.  Proximal and distal margins marked with submucosal SPOT tattoos. 2) Two sessile transverse polyps 7 and 10 mm removed with cold snare, completely and sent to pathology. 3) Otherwise normal colon, excellent prep.  Retroflexed views revealed no abnormalities. The time to cecum=3 minutes 51 seconds. Withdrawal time=27 minutes 59  seconds.  The scope was withdrawn and the procedure completed. COMPLICATIONS: There were no immediate complications.    ENDOSCOPIC IMPRESSION: 1) Large flat-sessile polyp in descending colon. Half-circumferenmce on a fold.  Saline lift/EMR technique used to remove 90% of polyp and sent to pathology.  10% of polyp ablated with tip cautery.  Proximal and distal margins marked with submucosal SPOT tattoos. 2) Two sessile transverse polyps 7 and 10 mm removed with cold snare, completely and sent to pathology. 3) Otherwise normal colon, excellent prep  RECOMMENDATIONS: 1.  Timing of repeat colonoscopy will be determined by pathology findings. 2.  Hold Aspirin and all other NSAIDS for 2 weeks.  eSigned:  Gatha Mayer, MD, [facility] 06/04/2014 3:50 PM   cc:  The Patient   PATIENT NAME:  Joshua Moreno, Joshua Moreno MR#: [id number]

## 2014-06-04 NOTE — Progress Notes (Signed)
Called to room to assist during endoscopic procedure.  Patient ID and intended procedure confirmed with present staff. Received instructions for my participation in the procedure from the performing physician.  

## 2014-06-04 NOTE — Progress Notes (Signed)
A/ox3, pleased with MAC, report to RN 

## 2014-06-05 ENCOUNTER — Other Ambulatory Visit: Payer: Self-pay | Admitting: Dermatology

## 2014-06-05 ENCOUNTER — Telehealth: Payer: Self-pay | Admitting: *Deleted

## 2014-06-05 NOTE — Telephone Encounter (Signed)
No answer, message left for the patient. 

## 2014-06-10 ENCOUNTER — Encounter: Payer: Self-pay | Admitting: Internal Medicine

## 2014-06-10 DIAGNOSIS — Z86010 Personal history of colonic polyps: Secondary | ICD-10-CM

## 2014-06-10 NOTE — Progress Notes (Signed)
Quick Note:  Descending TV adenoma - needs close f/u sigmoidoscopy w/ colon prep recall 11/2014 2 other adenomas  ______

## 2014-11-05 ENCOUNTER — Other Ambulatory Visit: Payer: Self-pay | Admitting: Internal Medicine

## 2014-11-05 NOTE — Telephone Encounter (Signed)
Can we refill medication until 01/06/2015. He has an appt with Dr. Tamala Julian.

## 2014-11-05 NOTE — Telephone Encounter (Signed)
Joshua Moreno pt called back, he has a physical in August and wants a 90 day supply. He states he cannot have the physical before then. He states he usually gets a year's worth of Rx. He does not understand what happened.

## 2014-11-05 NOTE — Telephone Encounter (Signed)
Will provide one refill.  Clinical staff informed patient needs to return within 90 days for annual physical. Philis Fendt, MS, PA-C   9:33 AM, 11/05/2014

## 2014-11-06 NOTE — Telephone Encounter (Signed)
I rx'd for 90 days initially.  Philis Fendt, MS, PA-C   8:18 AM, 11/06/2014

## 2014-11-24 ENCOUNTER — Encounter: Payer: Self-pay | Admitting: Internal Medicine

## 2014-12-29 ENCOUNTER — Other Ambulatory Visit: Payer: Self-pay | Admitting: Internal Medicine

## 2014-12-29 ENCOUNTER — Other Ambulatory Visit: Payer: Self-pay | Admitting: Physician Assistant

## 2015-01-06 ENCOUNTER — Encounter: Payer: Self-pay | Admitting: Family Medicine

## 2015-01-06 ENCOUNTER — Ambulatory Visit (INDEPENDENT_AMBULATORY_CARE_PROVIDER_SITE_OTHER): Payer: 59 | Admitting: Family Medicine

## 2015-01-06 VITALS — BP 137/76 | HR 50 | Temp 98.0°F | Resp 16 | Ht 67.5 in | Wt 190.8 lb

## 2015-01-06 DIAGNOSIS — E785 Hyperlipidemia, unspecified: Secondary | ICD-10-CM | POA: Diagnosis not present

## 2015-01-06 DIAGNOSIS — Z1159 Encounter for screening for other viral diseases: Secondary | ICD-10-CM | POA: Diagnosis not present

## 2015-01-06 DIAGNOSIS — Z87891 Personal history of nicotine dependence: Secondary | ICD-10-CM

## 2015-01-06 DIAGNOSIS — Z125 Encounter for screening for malignant neoplasm of prostate: Secondary | ICD-10-CM | POA: Diagnosis not present

## 2015-01-06 DIAGNOSIS — Z131 Encounter for screening for diabetes mellitus: Secondary | ICD-10-CM

## 2015-01-06 DIAGNOSIS — Z23 Encounter for immunization: Secondary | ICD-10-CM

## 2015-01-06 DIAGNOSIS — Z114 Encounter for screening for human immunodeficiency virus [HIV]: Secondary | ICD-10-CM

## 2015-01-06 DIAGNOSIS — Z0000 Encounter for general adult medical examination without abnormal findings: Secondary | ICD-10-CM | POA: Diagnosis not present

## 2015-01-06 DIAGNOSIS — E038 Other specified hypothyroidism: Secondary | ICD-10-CM

## 2015-01-06 DIAGNOSIS — Z860100 Personal history of colon polyps, unspecified: Secondary | ICD-10-CM

## 2015-01-06 DIAGNOSIS — E663 Overweight: Secondary | ICD-10-CM | POA: Diagnosis not present

## 2015-01-06 DIAGNOSIS — I4510 Unspecified right bundle-branch block: Secondary | ICD-10-CM | POA: Diagnosis not present

## 2015-01-06 DIAGNOSIS — E034 Atrophy of thyroid (acquired): Secondary | ICD-10-CM

## 2015-01-06 LAB — LIPID PANEL
CHOL/HDL RATIO: 2.7 ratio (ref <=5.0)
CHOLESTEROL: 181 mg/dL (ref 125–200)
HDL: 68 mg/dL (ref >=40)
LDL Cholesterol: 97 mg/dL (ref <130)
TRIGLYCERIDES: 78 mg/dL (ref <150)
VLDL: 16 mg/dL (ref <30)

## 2015-01-06 LAB — POCT URINALYSIS DIPSTICK
BILIRUBIN UA: NEGATIVE
Glucose, UA: NEGATIVE
KETONES UA: NEGATIVE
Leukocytes, UA: NEGATIVE
Nitrite, UA: NEGATIVE
PH UA: 7.5
Protein, UA: NEGATIVE
RBC UA: NEGATIVE
SPEC GRAV UA: 1.015
Urobilinogen, UA: 1

## 2015-01-06 LAB — CBC WITH DIFFERENTIAL/PLATELET
Basophils Absolute: 0.1 10*3/uL (ref 0.0–0.1)
Basophils Relative: 1 % (ref 0–1)
EOS ABS: 0.3 10*3/uL (ref 0.0–0.7)
EOS PCT: 3 % (ref 0–5)
HEMATOCRIT: 44.3 % (ref 39.0–52.0)
Hemoglobin: 15.2 g/dL (ref 13.0–17.0)
LYMPHS ABS: 1.6 10*3/uL (ref 0.7–4.0)
Lymphocytes Relative: 19 % (ref 12–46)
MCH: 31.5 pg (ref 26.0–34.0)
MCHC: 34.3 g/dL (ref 30.0–36.0)
MCV: 91.9 fL (ref 78.0–100.0)
MONOS PCT: 9 % (ref 3–12)
MPV: 10 fL (ref 8.6–12.4)
Monocytes Absolute: 0.8 10*3/uL (ref 0.1–1.0)
Neutro Abs: 5.7 10*3/uL (ref 1.7–7.7)
Neutrophils Relative %: 68 % (ref 43–77)
PLATELETS: 285 10*3/uL (ref 150–400)
RBC: 4.82 MIL/uL (ref 4.22–5.81)
RDW: 13 % (ref 11.5–15.5)
WBC: 8.4 10*3/uL (ref 4.0–10.5)

## 2015-01-06 LAB — COMPREHENSIVE METABOLIC PANEL
ALT: 36 U/L (ref 9–46)
AST: 27 U/L (ref 10–35)
Albumin: 4 g/dL (ref 3.6–5.1)
Alkaline Phosphatase: 79 U/L (ref 40–115)
BUN: 13 mg/dL (ref 7–25)
CALCIUM: 9.6 mg/dL (ref 8.6–10.3)
CHLORIDE: 106 mmol/L (ref 98–110)
CO2: 28 mmol/L (ref 20–31)
Creat: 1.11 mg/dL (ref 0.70–1.25)
Glucose, Bld: 99 mg/dL (ref 65–99)
POTASSIUM: 4.2 mmol/L (ref 3.5–5.3)
Sodium: 144 mmol/L (ref 135–146)
TOTAL PROTEIN: 6.7 g/dL (ref 6.1–8.1)
Total Bilirubin: 0.9 mg/dL (ref 0.2–1.2)

## 2015-01-06 LAB — T4, FREE: Free T4: 1.02 ng/dL (ref 0.80–1.80)

## 2015-01-06 LAB — TSH: TSH: 1.779 u[IU]/mL (ref 0.350–4.500)

## 2015-01-06 LAB — HEMOGLOBIN A1C
HEMOGLOBIN A1C: 5.4 % (ref <5.7)
MEAN PLASMA GLUCOSE: 108 mg/dL (ref <117)

## 2015-01-06 LAB — HIV ANTIBODY (ROUTINE TESTING W REFLEX): HIV: NONREACTIVE

## 2015-01-06 LAB — HEPATITIS C ANTIBODY: HCV Ab: NEGATIVE

## 2015-01-06 MED ORDER — LEVOTHYROXINE SODIUM 100 MCG PO TABS: ORAL_TABLET | ORAL | Status: DC

## 2015-01-06 MED ORDER — ATORVASTATIN CALCIUM 20 MG PO TABS: 20.0000 mg | ORAL_TABLET | Freq: Every day | ORAL | Status: DC

## 2015-01-06 MED ORDER — ATORVASTATIN CALCIUM 10 MG PO TABS: ORAL_TABLET | ORAL | Status: DC

## 2015-01-06 NOTE — Patient Instructions (Signed)
Pneumococcal Vaccine, Polyvalent suspension for injection What is this medicine? PNEUMOCOCCAL VACCINE, POLYVALENT (NEU mo KOK al vak SEEN, pol ee VEY luhnt) is a vaccine to prevent pneumococcus bacteria infection. These bacteria are a major cause of ear infections, 'Strep throat' infections, and serious pneumonia, meningitis, or blood infections worldwide. These vaccines help the body to produce antibodies (protective substances) that help your body defend against these bacteria. This vaccine is recommended for infants and young children. This vaccine will not treat an infection. This medicine may be used for other purposes; ask your health care provider or pharmacist if you have questions. COMMON BRAND NAME(S): Prevnar 13 What should I tell my health care provider before I take this medicine? They need to know if you have any of these conditions: -bleeding problems -fever -immune system problems -low platelet count in the blood -seizures -an unusual or allergic reaction to pneumococcal vaccine, diphtheria toxoid, other vaccines, latex, other medicines, foods, dyes, or preservatives -pregnant or trying to get pregnant -breast-feeding How should I use this medicine? This vaccine is for injection into a muscle. It is given by a health care professional. A copy of Vaccine Information Statements will be given before each vaccination. Read this sheet carefully each time. The sheet may change frequently. Talk to your pediatrician regarding the use of this medicine in children. While this drug may be prescribed for children as young as 38 weeks old for selected conditions, precautions do apply. Overdosage: If you think you have taken too much of this medicine contact a poison control center or emergency room at once. NOTE: This medicine is only for you. Do not share this medicine with others. What if I miss a dose? It is important not to miss your dose. Call your doctor or health care professional if  you are unable to keep an appointment. What may interact with this medicine? -medicines for cancer chemotherapy -medicines that suppress your immune function -medicines that treat or prevent blood clots like warfarin, enoxaparin, and dalteparin -steroid medicines like prednisone or cortisone This list may not describe all possible interactions. Give your health care provider a list of all the medicines, herbs, non-prescription drugs, or dietary supplements you use. Also tell them if you smoke, drink alcohol, or use illegal drugs. Some items may interact with your medicine. What should I watch for while using this medicine? Mild fever and pain should go away in 3 days or less. Report any unusual symptoms to your doctor or health care professional. What side effects may I notice from receiving this medicine? Side effects that you should report to your doctor or health care professional as soon as possible: -allergic reactions like skin rash, itching or hives, swelling of the face, lips, or tongue -breathing problems -confused -fever over 102 degrees F -pain, tingling, numbness in the hands or feet -seizures -unusual bleeding or bruising -unusual muscle weakness Side effects that usually do not require medical attention (report to your doctor or health care professional if they continue or are bothersome): -aches and pains -diarrhea -fever of 102 degrees F or less -headache -irritable -loss of appetite -pain, tender at site where injected -trouble sleeping This list may not describe all possible side effects. Call your doctor for medical advice about side effects. You may report side effects to FDA at 1-800-FDA-1088. Where should I keep my medicine? This does not apply. This vaccine is given in a clinic, pharmacy, doctor's office, or other health care setting and will not be stored at home. NOTE:  This sheet is a summary. It may not cover all possible information. If you have questions  about this medicine, talk to your doctor, pharmacist, or health care provider.  2015, Elsevier/Gold Standard. (2008-07-07 10:17:22)   Keeping you healthy  Get these tests  Blood pressure- Have your blood pressure checked once a year by your healthcare provider.  Normal blood pressure is 120/80  Weight- Have your body mass index (BMI) calculated to screen for obesity.  BMI is a measure of body fat based on height and weight. You can also calculate your own BMI at [email or web address].  Cholesterol- Have your cholesterol checked every year.  Diabetes- Have your blood sugar checked regularly if you have high blood pressure, high cholesterol, have a family history of diabetes or if you are overweight.  Screening for Colon Cancer- Colonoscopy starting at age 15.  Screening may begin sooner depending on your family history and other health conditions. Follow up colonoscopy as directed by your Gastroenterologist.  Screening for Prostate Cancer- Both blood work (PSA) and a rectal exam help screen for Prostate Cancer.  Screening begins at age 87 with African-American men and at age 52 with Caucasian men.  Screening may begin sooner depending on your family history.  Take these medicines  Aspirin- One aspirin daily can help prevent Heart disease and Stroke.  Flu shot- Every fall.  Tetanus- Every 10 years.  Zostavax- Once after the age of 66 to prevent Shingles.  Pneumonia shot- Once after the age of 32; if you are younger than 72, ask your healthcare provider if you need a Pneumonia shot.  Take these steps  Don't smoke- If you do smoke, talk to your doctor about quitting.  For tips on how to quit, go to www.smokefree.gov or call 1-800-QUIT-NOW.  Be physically active- Exercise 5 days a week for at least 30 minutes.  If you are not already physically active start slow and gradually work up to 30 minutes of moderate physical activity.  Examples of moderate activity include walking  briskly, mowing the yard, dancing, swimming, bicycling, etc.  Eat a healthy diet- Eat a variety of healthy food such as fruits, vegetables, low fat milk, low fat cheese, yogurt, lean meant, poultry, fish, beans, tofu, etc. For more information go to www.thenutritionsource.org  Drink alcohol in moderation- Limit alcohol intake to less than two drinks a day. Never drink and drive.  Dentist- Brush and floss twice daily; visit your dentist twice a year.  Depression- Your emotional health is as important as your physical health. If you're feeling down, or losing interest in things you would normally enjoy please talk to your healthcare provider.  Eye exam- Visit your eye doctor every year.  Safe sex- If you may be exposed to a sexually transmitted infection, use a condom.  Seat belts- Seat belts can save your life; always wear one.  Smoke/Carbon Monoxide detectors- These detectors need to be installed on the appropriate level of your home.  Replace batteries at least once a year.  Skin cancer- When out in the sun, cover up and use sunscreen 15 SPF or higher.  Violence- If anyone is threatening you, please tell your healthcare provider.  Living Will/ Health care power of attorney- Speak with your healthcare provider and family.

## 2015-01-06 NOTE — Progress Notes (Signed)
   Subjective:    Patient ID: Joshua Moreno, male    DOB: 02-07-1951, 64 y.o.   MRN: [id number]  HPI    Review of Systems  Constitutional: Negative.   HENT: Negative.   Eyes: Negative.   Respiratory: Negative.   Cardiovascular: Negative.   Gastrointestinal: Negative.   Endocrine: Negative.   Genitourinary: Negative.   Musculoskeletal: Negative.   Skin: Negative.   Allergic/Immunologic: Negative.   Neurological: Negative.   Hematological: Bruises/bleeds easily.  Psychiatric/Behavioral: Negative.        Objective:   Physical Exam        Assessment & Plan:

## 2015-01-06 NOTE — Progress Notes (Signed)
Subjective:    Patient ID: Joshua Moreno, male    DOB: 1951/02/16, 64 y.o.   MRN: [id number]  01/06/2015  Annual Exam   HPI This 64 y.o. male presents for Welcome to Medicare Physical.  Last physical: 12-29-13 Guest Colonoscopy:  06-04-2014 +polyps.  Carlean Purl.  Repeat later this year. TDAP:  2013 Pneumovax:  Pneumovax 2011; Prevnar 13 never Zostavax:  Never; recommended; considering.  Influenza:  refuses Eye exam:  05/2014; no g/c.  Readers. Dental exam:  Every six months.  Hyperlipidemia: Patient reports good compliance with medication, good tolerance to medication, and good symptom control.  Fasting.  Hypothyroidism: Patient reports good compliance with medication, good tolerance to medication, and good symptom control.   Duration 10 years.  BPH:  Referred to urologist; elevated PSA; resolved.  PSA was 27 and then normalized.   Review of Systems  Constitutional: Negative for fever, chills, diaphoresis, activity change, appetite change, fatigue and unexpected weight change.  HENT: Negative for congestion, dental problem, drooling, ear discharge, ear pain, facial swelling, hearing loss, mouth sores, nosebleeds, postnasal drip, rhinorrhea, sinus pressure, sneezing, sore throat, tinnitus, trouble swallowing and voice change.   Eyes: Negative for photophobia, pain, discharge, redness, itching and visual disturbance.  Respiratory: Negative for apnea, cough, choking, chest tightness, shortness of breath, wheezing and stridor.   Cardiovascular: Negative for chest pain, palpitations and leg swelling.  Gastrointestinal: Negative for nausea, vomiting, abdominal pain, diarrhea, constipation and blood in stool.  Endocrine: Negative for cold intolerance, heat intolerance, polydipsia, polyphagia and polyuria.  Genitourinary: Negative for dysuria, urgency, frequency, hematuria, flank pain, decreased urine volume, discharge, penile swelling, scrotal swelling, enuresis, difficulty urinating, genital  sores, penile pain and testicular pain.  Musculoskeletal: Negative for myalgias, back pain, joint swelling, arthralgias, gait problem, neck pain and neck stiffness.  Skin: Negative for color change, pallor, rash and wound.  Allergic/Immunologic: Negative for environmental allergies, food allergies and immunocompromised state.  Neurological: Negative for dizziness, tremors, seizures, syncope, facial asymmetry, speech difficulty, weakness, light-headedness, numbness and headaches.  Hematological: Negative for adenopathy. Bruises/bleeds easily.  Psychiatric/Behavioral: Negative for suicidal ideas, hallucinations, behavioral problems, confusion, sleep disturbance, self-injury, dysphoric mood, decreased concentration and agitation. The patient is not nervous/anxious and is not hyperactive.     Past Medical History  Diagnosis Date  . Thyroid disease   . Hyperlipidemia   . Personal history of colonic polyps-adenoma 06/26/2008   Past Surgical History  Procedure Laterality Date  . Tail bone     Allergies  Allergen Reactions  . Cinnamon Hives   Current Outpatient Prescriptions  Medication Sig Dispense Refill  . aspirin 81 MG tablet Take 81 mg by mouth daily. Do not take again until Jun 18, 2014    . atorvastatin (LIPITOR) 10 MG tablet TAKE 1 TABLET BY MOUTH DAILY 90 tablet 3  . levothyroxine (SYNTHROID, LEVOTHROID) 100 MCG tablet TAKE 1 TABLET BY MOUTH DAILY 90 tablet 3   No current facility-administered medications for this visit.   Social History   Social History  . Marital Status: Married    Spouse Name: N/A  . Number of Children: N/A  . Years of Education: N/A   Occupational History  . Not on file.   Social History Main Topics  . Smoking status: Former Research scientist (life sciences)  . Smokeless tobacco: Never Used  . Alcohol Use: 3.6 oz/week    6 Standard drinks or equivalent per week  . Drug Use: No  . Sexual Activity: Yes   Other Topics Concern  . 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      Tobacco: quit in 2000.      Alcohol:  Beer or bourbon daily      Exercise:  Plays tennis twice weekly.           Family History  Problem Relation Age of Onset  . Heart disease Father 40    CABG/CAD  . Colon cancer Neg Hx   . Esophageal cancer Neg Hx   . Rectal cancer Neg Hx   . Stomach cancer Neg Hx        Objective:    BP 137/76 mmHg  Pulse 50  Temp(Src) 98 F (36.7 C) (Oral)  Resp 16  Ht 5' 7.5" (1.715 m)  Wt 190 lb 12.8 oz (86.546 kg)  BMI 29.43 kg/m2 Physical Exam  Constitutional: He is oriented to person, place, and time. He appears well-developed and well-nourished. No distress.  HENT:  Head: Normocephalic and atraumatic.  Right Ear: External ear normal.  Left Ear: External ear normal.  Nose: Nose normal.  Mouth/Throat: Oropharynx is clear and moist.  Eyes: Conjunctivae and EOM are normal. Pupils are equal, round, and reactive to light.  Neck: Normal range of motion. Neck supple. Carotid bruit is not present. No thyromegaly present.  Cardiovascular: Normal rate, regular rhythm, normal heart sounds and intact distal pulses.  Exam reveals no gallop and no friction rub.   No murmur heard. Trace pitting edema L>R legs.  Pulmonary/Chest: Effort normal and breath sounds normal. He has no wheezes. He has no rales.  Abdominal: Soft. Bowel sounds are normal. He exhibits no distension and no mass. There is no tenderness. There is no rebound and no guarding. Hernia confirmed negative in the right inguinal area and confirmed negative in the left inguinal area.  Genitourinary: Prostate normal and penis normal. Rectal exam shows external hemorrhoid. Prostate is not enlarged and not tender. Right testis shows swelling. Right testis shows no mass and no tenderness. Left testis shows no mass, no  swelling and no tenderness. Circumcised.  R testicle with swelling/enlargement non-tender and pt reports is chronic.  Musculoskeletal:       Right shoulder: Normal.       Left shoulder: Normal.       Cervical back: Normal.  Lymphadenopathy:    He has no cervical adenopathy.       Right: No inguinal adenopathy present.       Left: No inguinal adenopathy present.  Neurological: He is alert and oriented to person, place, and time. He has normal reflexes. No cranial nerve deficit. He exhibits normal muscle tone. Coordination normal.  Skin: Skin is warm and dry. No rash noted. He is not diaphoretic.  Diffuse sun related changes throughout especially on forehead.  Psychiatric: He has a normal mood and affect. His behavior is normal. Judgment and thought content normal.   Results for orders placed or performed in visit on 12/29/13  TSH  Result Value Ref Range   TSH 1.483 0.350 - 4.500 uIU/mL  PSA  Result Value Ref Range   PSA 1.38 <=4.00 ng/mL  Lipid panel  Result Value Ref Range   Cholesterol 253 (H) 0 - 200 mg/dL   Triglycerides 92 <150 mg/dL  HDL 70 >39 mg/dL   Total CHOL/HDL Ratio 3.6 Ratio   VLDL 18 0 - 40 mg/dL   LDL Cholesterol 165 (H) 0 - 99 mg/dL  Comprehensive metabolic panel  Result Value Ref Range   Sodium 143 135 - 145 mEq/L   Potassium 4.6 3.5 - 5.3 mEq/L   Chloride 106 96 - 112 mEq/L   CO2 29 19 - 32 mEq/L   Glucose, Bld 114 (H) 70 - 99 mg/dL   BUN 19 6 - 23 mg/dL   Creat 1.18 0.50 - 1.35 mg/dL   Total Bilirubin 0.6 0.2 - 1.2 mg/dL   Alkaline Phosphatase 71 39 - 117 U/L   AST 29 0 - 37 U/L   ALT 34 0 - 53 U/L   Total Protein 6.8 6.0 - 8.3 g/dL   Albumin 4.2 3.5 - 5.2 g/dL   Calcium 9.6 8.4 - 10.5 mg/dL  POCT CBC  Result Value Ref Range   WBC 11.1 (A) 4.6 - 10.2 K/uL   Lymph, poc 1.7 0.6 - 3.4   POC LYMPH PERCENT 15.2 10 - 50 %L   MID (cbc) 0.8 0 - 0.9   POC MID % 7.3 0 - 12 %M   POC Granulocyte 8.6 (A) 2 - 6.9   Granulocyte percent 77.5 37 - 80 %G   RBC  4.88 4.69 - 6.13 M/uL   Hemoglobin 15.5 14.1 - 18.1 g/dL   HCT, POC 46.5 43.5 - 53.7 %   MCV 95.4 80 - 97 fL   MCH, POC 31.7 (A) 27 - 31.2 pg   MCHC 33.3 31.8 - 35.4 g/dL   RDW, POC 13.2 %   Platelet Count, POC 275 142 - 424 K/uL   MPV 6.9 0 - 99.8 fL  POCT UA - Microscopic Only  Result Value Ref Range   WBC, Ur, HPF, POC neg    RBC, urine, microscopic neg    Bacteria, U Microscopic neg    Mucus, UA trace    Epithelial cells, urine per micros 0-1    Crystals, Ur, HPF, POC neg    Casts, Ur, LPF, POC neg    Yeast, UA neg   POCT urinalysis dipstick  Result Value Ref Range   Color, UA yellow    Clarity, UA clear    Glucose, UA neg    Bilirubin, UA neg    Ketones, UA neg    Spec Grav, UA 1.020    Blood, UA neg    pH, UA 5.0    Protein, UA neg    Urobilinogen, UA 0.2    Nitrite, UA neg    Leukocytes, UA Negative    PREVNAR-13 ADMINISTERED IN OFFICE.  EKG: sinus brady at 51; RBBB.    Assessment & Plan:   1. Routine physical examination   2. History of colonic polyps   3. BPH (benign prostatic hypertrophy)   4. History of tobacco use   5. Hyperlipidemia   6. Hypothyroidism due to acquired atrophy of thyroid   7. Overweight (BMI 25.0-29.9)   8. RBBB   9. Need for prophylactic vaccination against Streptococcus pneumoniae (pneumococcus)   10. Need for hepatitis C screening test   11. Screening for HIV (human immunodeficiency virus)   12. Screening for diabetes mellitus   13. Screening for prostate cancer     1.   Complete Physical Examination: anticipatory guidance --- weight loss, exercise, ASA 81mg  daily. Colonoscopy UTD.  Obtain PSA. Immunizations reviewed; s/p Prevnar 13 today; plans to receive  Zostavax this year; refuses flu vaccine. 2.  History of colon polyps: s/p colonoscopy 05/2014; scheduled for repeat later this year with Carlean Purl. 3.  Hyperlipidemia: stable; obtain labs; refill provided. 4.  Hypothyroidism: stable; obtain labs; refill provided. 5.   Overweight: recommend weight loss, exercise, low-caloric food choices; weight down 2 pounds in past year. 6.  RBBB: persistent. 7.  S/p Prevnar 13. 8.  Screening Hepatitis C in [place]: obtain Hepatitis C ab. 9.  Screening HIV: per CDC guidelines, obtain HIV. 10.  Screening DMII: obtain glucose, HgbA1c. 11.  Screening prostate cancer: s/p DRE: obtain PSA.  History of elevated PSA in past but returned to normal; s/p urological evaluation in past.   Orders Placed This Encounter  Procedures  . Pneumococcal conjugate vaccine 13-valent IM  . CBC with Differential/Platelet  . Comprehensive metabolic panel    Order Specific Question:  Has the patient fasted?    Answer:  Yes  . Lipid panel    Order Specific Question:  Has the patient fasted?    Answer:  Yes  . PSA  . TSH  . T4, free  . Hepatitis C antibody  . HIV antibody  . Hemoglobin A1c  . POCT urinalysis dipstick  . EKG 12-Lead   Meds ordered this encounter  Medications  . levothyroxine (SYNTHROID, LEVOTHROID) 100 MCG tablet    Sig: TAKE 1 TABLET BY MOUTH DAILY    Dispense:  90 tablet    Refill:  3  . atorvastatin (LIPITOR) 10 MG tablet    Sig: TAKE 1 TABLET BY MOUTH DAILY    Dispense:  90 tablet    Refill:  3    Return in about 1 year (around 01/06/2016) for complete physical examiniation.    Lafe Clerk Elayne Guerin, M.D. [facility] [address] [place], [place]  [phone] phone [phone] fax

## 2015-01-07 LAB — PSA: PSA: 1.25 ng/mL (ref <=4.00)

## 2015-02-16 ENCOUNTER — Encounter: Payer: Self-pay | Admitting: Internal Medicine

## 2015-03-29 ENCOUNTER — Ambulatory Visit (AMBULATORY_SURGERY_CENTER): Payer: Self-pay

## 2015-03-29 VITALS — Ht 68.0 in | Wt 194.5 lb

## 2015-03-29 DIAGNOSIS — Z860100 Personal history of colon polyps, unspecified: Secondary | ICD-10-CM

## 2015-03-29 NOTE — Progress Notes (Signed)
No allergies to eggs or soy No home oxygen No past problems of anesthesia No diet/weight loss meds  Has email and internet

## 2015-04-13 ENCOUNTER — Ambulatory Visit (AMBULATORY_SURGERY_CENTER): Payer: 59 | Admitting: Internal Medicine

## 2015-04-13 ENCOUNTER — Encounter: Payer: Self-pay | Admitting: Internal Medicine

## 2015-04-13 VITALS — BP 122/70 | HR 42 | Temp 95.9°F | Resp 15 | Ht 68.0 in | Wt 194.0 lb

## 2015-04-13 DIAGNOSIS — D124 Benign neoplasm of descending colon: Secondary | ICD-10-CM | POA: Diagnosis not present

## 2015-04-13 DIAGNOSIS — Z86010 Personal history of colonic polyps: Secondary | ICD-10-CM

## 2015-04-13 DIAGNOSIS — K639 Disease of intestine, unspecified: Secondary | ICD-10-CM

## 2015-04-13 DIAGNOSIS — K6389 Other specified diseases of intestine: Secondary | ICD-10-CM | POA: Diagnosis not present

## 2015-04-13 MED ORDER — SODIUM CHLORIDE 0.9 % IV SOLN: 500.0000 mL | INTRAVENOUS | Status: DC

## 2015-04-13 NOTE — Op Note (Signed)
[place]  [address] [place], [address]   FLEXIBLE SIGMOIDOSCOPY PROCEDURE REPORT  PATIENT: Joshua Moreno, Joshua Moreno  MR#: [id number] BIRTHDATE: 01/29/1951 , 63  yrs. old GENDER: male ENDOSCOPIST: Gatha Mayer, MD, [facility] PROCEDURE DATE:  04/13/2015 PROCEDURE:   Sigmoidoscopy with biopsy and Sigmoidoscopy with snare  ASA CLASS:   Class I INDICATIONS:f/u large descending polyp removal from 05/2014. MEDICATIONS: Propofol 200 mg IV and Monitored anesthesia care  DESCRIPTION OF PROCEDURE:   After the risks benefits and alternatives of the procedure were thoroughly explained, informed consent was obtained.  Digital exam revealed no abnormalities of the rectum. The LB [id number]  endoscope was introduced through the anus  and advanced to the splenic flexure , The exam was Without limitations.    The quality of the prep was The overall prep quality was good. . Estimated blood loss is zero unless otherwise noted in this procedure report. The instrument was then slowly withdrawn as the mucosa was fully examined.         COLON FINDINGS: 1) Polypectomy scar seen between 2 tattoos descending colon - biopsied. 2) 4 mm descending polyp snared 3) Otherwise normal.    Retroflexed views revealed no abnormalities. The scope was then withdrawn from the patient and the procedure terminated.  COMPLICATIONS: There were no immediate complications.  ENDOSCOPIC IMPRESSION: 1) Polypectomy scar seen between 2 tattoos descending colon - biopsied. 2) 4 mm descending polyp snared 3) Otherwise normal  RECOMMENDATIONS: 1.  Await biopsy results 2.  Likely repeat colonoscopy 2019-20    eSigned:  Gatha Mayer, MD, [facility] 04/13/2015 3:04 PM   CC:The Patient

## 2015-04-13 NOTE — Patient Instructions (Addendum)
I did not see any remaining of the large polyp but found another tiny polyp. I took biopsies at the site of large polyp removal. Unless anything different i anticipate you needing a colonoscopy again in 2019-20.  I will let you know pathology results and when to have another routine colonoscopy by mail.  I appreciate the opportunity to care for you. Gatha Mayer, MD, FACG YOU HAD AN ENDOSCOPIC PROCEDURE TODAY AT [facility]:   Refer to the procedure report that was given to you for any specific questions about what was found during the examination.  If the procedure report does not answer your questions, please call your gastroenterologist to clarify.  If you requested that your care partner not be given the details of your procedure findings, then the procedure report has been included in a sealed envelope for you to review at your convenience later.  YOU SHOULD EXPECT: Some feelings of bloating in the abdomen. Passage of more gas than usual.  Walking can help get rid of the air that was put into your GI tract during the procedure and reduce the bloating. If you had a lower endoscopy (such as a colonoscopy or flexible sigmoidoscopy) you may notice spotting of blood in your stool or on the toilet paper. If you underwent a bowel prep for your procedure, you may not have a normal bowel movement for a few days.  Please Note:  You might notice some irritation and congestion in your nose or some drainage.  This is from the oxygen used during your procedure.  There is no need for concern and it should clear up in a day or so.  SYMPTOMS TO REPORT IMMEDIATELY:   Following lower endoscopy (colonoscopy or flexible sigmoidoscopy):  Excessive amounts of blood in the stool  Significant tenderness or worsening of abdominal pains  Swelling of the abdomen that is new, acute  Fever of 100F or higher   For urgent or emergent issues, a gastroenterologist can be reached at any hour  by calling [phone].   DIET: Your first meal following the procedure should be a small meal and then it is ok to progress to your normal diet. Heavy or fried foods are harder to digest and may make you feel nauseous or bloated.  Likewise, meals heavy in dairy and vegetables can increase bloating.  Drink plenty of fluids but you should avoid alcoholic beverages for 24 hours.  ACTIVITY:  You should plan to take it easy for the rest of today and you should NOT DRIVE or use heavy machinery until tomorrow (because of the sedation medicines used during the test).    FOLLOW UP: Our staff will call the number listed on your records the next business day following your procedure to check on you and address any questions or concerns that you may have regarding the information given to you following your procedure. If we do not reach you, we will leave a message.  However, if you are feeling well and you are not experiencing any problems, there is no need to return our call.  We will assume that you have returned to your regular daily activities without incident.  If any biopsies were taken you will be contacted by phone or by letter within the next 1-3 weeks.  Please call us at [phone] if you have not heard about the biopsies in 3 weeks.    SIGNATURES/CONFIDENTIALITY: You and/or your care partner have signed paperwork which will be entered  into your electronic medical record.  These signatures attest to the fact that that the information above on your After Visit Summary has been reviewed and is understood.  Full responsibility of the confidentiality of this discharge information lies with you and/or your care-partner.

## 2015-04-13 NOTE — Progress Notes (Signed)
Called to room to assist during endoscopic procedure.  Patient ID and intended procedure confirmed with present staff. Received instructions for my participation in the procedure from the performing physician.  

## 2015-04-14 ENCOUNTER — Telehealth: Payer: Self-pay | Admitting: *Deleted

## 2015-04-14 NOTE — Telephone Encounter (Signed)
  Follow up Call-  Call back number 04/13/2015 06/04/2014  Post procedure Call Back phone  # [phone] cell [phone]  Permission to leave phone message Yes Yes     Patient questions:  Do you have a fever, pain , or abdominal swelling? No. Pain Score  0 *  Have you tolerated food without any problems? Yes.    Have you been able to return to your normal activities? Yes.    Do you have any questions about your discharge instructions: Diet   No. Medications  No. Follow up visit  No.  Do you have questions or concerns about your Care? No.  Actions: * If pain score is 4 or above: No action needed, pain <4.

## 2015-04-19 ENCOUNTER — Encounter: Payer: Self-pay | Admitting: Internal Medicine

## 2015-04-19 DIAGNOSIS — Z86010 Personal history of colonic polyps: Secondary | ICD-10-CM

## 2015-04-19 NOTE — Progress Notes (Signed)
Quick Note:  4 mm new adenoma No residual polyp at scar Repeat colonoscopy 2019-20 ______

## 2015-06-10 ENCOUNTER — Telehealth: Payer: Self-pay | Admitting: Family Medicine

## 2015-06-10 NOTE — Telephone Encounter (Signed)
lmom of pt new appt date 01-11-2016 @ 8:15

## 2015-07-01 ENCOUNTER — Ambulatory Visit (INDEPENDENT_AMBULATORY_CARE_PROVIDER_SITE_OTHER): Payer: 59 | Admitting: Family Medicine

## 2015-07-01 VITALS — BP 142/82 | HR 80 | Temp 99.2°F | Resp 16 | Ht 68.0 in | Wt 187.0 lb

## 2015-07-01 DIAGNOSIS — J111 Influenza due to unidentified influenza virus with other respiratory manifestations: Secondary | ICD-10-CM | POA: Diagnosis not present

## 2015-07-01 DIAGNOSIS — H65194 Other acute nonsuppurative otitis media, recurrent, right ear: Secondary | ICD-10-CM

## 2015-07-01 DIAGNOSIS — H109 Unspecified conjunctivitis: Secondary | ICD-10-CM | POA: Diagnosis not present

## 2015-07-01 DIAGNOSIS — R6889 Other general symptoms and signs: Secondary | ICD-10-CM

## 2015-07-01 LAB — POCT INFLUENZA A/B
Influenza A, POC: NEGATIVE
Influenza B, POC: NEGATIVE

## 2015-07-01 MED ORDER — AMOXICILLIN-POT CLAVULANATE 875-125 MG PO TABS: 1.0000 | ORAL_TABLET | Freq: Two times a day (BID) | ORAL | Status: DC

## 2015-07-01 MED ORDER — PSEUDOEPHEDRINE HCL ER 120 MG PO TB12: 120.0000 mg | ORAL_TABLET | Freq: Two times a day (BID) | ORAL | Status: DC

## 2015-07-01 NOTE — Patient Instructions (Addendum)
1.  AFRIN NASAL SPRAY --- 2 SPRAYS INTO EACH NOSTRIL TWICE DAILY FOR FIVE DAYS ONLY.   Influenza, Adult Influenza ("the flu") is a viral infection of the respiratory tract. It occurs more often in winter months because people spend more time in close contact with one another. Influenza can make you feel very sick. Influenza easily spreads from person to person (contagious). CAUSES  Influenza is caused by a virus that infects the respiratory tract. You can catch the virus by breathing in droplets from an infected person's cough or sneeze. You can also catch the virus by touching something that was recently contaminated with the virus and then touching your mouth, nose, or eyes. RISKS AND COMPLICATIONS You may be at risk for a more severe case of influenza if you smoke cigarettes, have diabetes, have chronic heart disease (such as heart failure) or lung disease (such as asthma), or if you have a weakened immune system. Elderly people and pregnant women are also at risk for more serious infections. The most common problem of influenza is a lung infection (pneumonia). Sometimes, this problem can require emergency medical care and may be life threatening. SIGNS AND SYMPTOMS  Symptoms typically last 4 to 10 days and may include:  Fever.  Chills.  Headache, body aches, and muscle aches.  Sore throat.  Chest discomfort and cough.  Poor appetite.  Weakness or feeling tired.  Dizziness.  Nausea or vomiting. DIAGNOSIS  Diagnosis of influenza is often made based on your history and a physical exam. A nose or throat swab test can be done to confirm the diagnosis. TREATMENT  In mild cases, influenza goes away on its own. Treatment is directed at relieving symptoms. For more severe cases, your health care provider may prescribe antiviral medicines to shorten the sickness. Antibiotic medicines are not effective because the infection is caused by a virus, not by bacteria. HOME CARE  INSTRUCTIONS  Take medicines only as directed by your health care provider.  Use a cool mist humidifier to make breathing easier.  Get plenty of rest until your temperature returns to normal. This usually takes 3 to 4 days.  Drink enough fluid to keep your urine clear or pale yellow.  Cover yourmouth and nosewhen coughing or sneezing,and wash your handswellto prevent thevirusfrom spreading.  Stay homefromwork orschool untilthe fever is gonefor at least 19full day. PREVENTION  An annual influenza vaccination (flu shot) is the best way to avoid getting influenza. An annual flu shot is now routinely recommended for all adults in the [place] IF:  You experiencechest pain, yourcough worsens,or you producemore mucus.  Youhave nausea,vomiting, ordiarrhea.  Your fever returns or gets worse. SEEK IMMEDIATE MEDICAL CARE IF:  You havetrouble breathing, you become short of breath,or your skin ornails becomebluish.  You have severe painor stiffnessin the neck.  You develop a sudden headache, or pain in the face or ear.  You have nausea or vomiting that you cannot control. MAKE SURE YOU:   Understand these instructions.  Will watch your condition.  Will get help right away if you are not doing well or get worse.   This information is not intended to replace advice given to you by your health care provider. Make sure you discuss any questions you have with your health care provider.   Document Released: 04/21/2000 Document Revised: 05/15/2014 Document Reviewed: 07/24/2011 Elsevier Interactive Patient Education [employer].

## 2015-07-01 NOTE — Progress Notes (Signed)
Subjective:    Patient ID: Joshua Moreno, male    DOB: 02/27/1951, 65 y.o.   MRN: [id number]  07/01/2015  Cough; Sore Throat; Sinus Problem; and Eye Problem   HPI This 65 y.o. male presents for evaluation of cough, sore throat, sinus congestion.   Onset five days ago.  No fever but low grade here.  No chills/sweats.  +body aches intermittent and all over.  Not working for two days.  Taking naps.  No headache.  +ear pain sharp pains intermittent.  +rhinorrhea; +nasal congestion; clear.  Intermittent cough.  No SOB.  No wheezing.  No n/v/d.  Decreased appetite.  Onset of eye redness two days ago; no itching; watering a lot; matting in morning; eye discharge during the day.  +blurred vision intermittent.  S/p flu vaccine.  Taking natural remedies without improvement.  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      Review of Systems  Constitutional: Positive for fever and fatigue. Negative for chills and diaphoresis.  HENT: Positive for congestion, ear pain, postnasal drip, rhinorrhea, sore throat and voice change. Negative for nosebleeds, sinus pressure and trouble swallowing.   Eyes: Positive for discharge and redness. Negative for photophobia, pain, itching and visual disturbance.  Respiratory: Positive for cough. Negative for shortness of breath and wheezing.   Gastrointestinal: Negative for nausea, vomiting, abdominal pain and diarrhea.  Neurological: Negative for dizziness and headaches.    Past Medical History  Diagnosis Date  . Thyroid disease   . Hyperlipidemia   . Personal history of colonic polyps-adenoma 06/26/2008  . Skin cancer of face    Past Surgical History  Procedure Laterality Date  . Tail bone    . Hydrocele excision / repair      R scrotum   Allergies  Allergen Reactions  . Cinnamon Hives    Social History   Social History  . Marital Status: Married    Spouse Name: N/A  . Number of Children: N/A  . Years of Education: N/A   Occupational  History  . Not on file.   Social History Main Topics  . Smoking status: Former Research scientist (life sciences)  . Smokeless tobacco: Never Used  . Alcohol Use: 3.6 oz/week    6 Standard drinks or equivalent per week     Comment: 1-2 beers per week and a glass of wine  . Drug Use: No  . Sexual Activity: Yes   Other Topics Concern  . 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      Tobacco: quit in 2000.      Alcohol:  Beer or bourbon daily      Exercise:  Plays tennis twice weekly.           Family History  Problem Relation Age of Onset  . Heart disease Father 48    CABG/CAD  . Colon cancer Neg Hx   . Esophageal cancer Neg Hx   . Rectal cancer Neg Hx   . Stomach cancer Neg Hx        Objective:    BP 142/82 mmHg  Pulse 80  Temp(Src) 99.2 F (37.3 C) (Oral)  Resp 16  Ht 5\' 8"  (1.727 m)  Wt 187 lb (  84.823 kg)  BMI 28.44 kg/m2  SpO2 97% Physical Exam  Constitutional: He is oriented to person, place, and time. He appears well-developed and well-nourished. No distress.  HENT:  Head: Normocephalic and atraumatic.  Right Ear: External ear and ear canal normal. Tympanic membrane is erythematous and retracted. Tympanic membrane is not perforated and not bulging.  Left Ear: Tympanic membrane, external ear and ear canal normal.  Nose: Mucosal edema and rhinorrhea present. Right sinus exhibits no maxillary sinus tenderness and no frontal sinus tenderness. Left sinus exhibits no maxillary sinus tenderness and no frontal sinus tenderness.  Mouth/Throat: Oropharynx is clear and moist.  Eyes: EOM are normal. Pupils are equal, round, and reactive to light. Right conjunctiva is injected. Left conjunctiva is injected.  Neck: Normal range of motion. Neck supple. Carotid bruit is not present. No thyromegaly present.  Cardiovascular:  Normal rate, regular rhythm, normal heart sounds and intact distal pulses.  Exam reveals no gallop and no friction rub.   No murmur heard. Pulmonary/Chest: Effort normal and breath sounds normal. He has no wheezes. He has no rales.  Abdominal: Soft. Bowel sounds are normal. He exhibits no distension and no mass. There is no tenderness. There is no rebound and no guarding.  Lymphadenopathy:    He has no cervical adenopathy.  Neurological: He is alert and oriented to person, place, and time. No cranial nerve deficit.  Skin: Skin is warm and dry. No rash noted. He is not diaphoretic.  Psychiatric: He has a normal mood and affect. His behavior is normal.  Nursing note and vitals reviewed.  Results for orders placed or performed in visit on 07/01/15  POCT Influenza A/B  Result Value Ref Range   Influenza A, POC Negative Negative   Influenza B, POC Negative Negative       Assessment & Plan:   1. Influenza   2. Flu-like symptoms   3. Bilateral conjunctivitis   4. Other recurrent acute nonsuppurative otitis media of right ear    -New. -Outside of 48 hour window, thus will not treat with Tamiflu. Other than age, no other comorbidities. -Rx for Augmentin provided for otitis media. Rx for Sudafed provided for nasal congestion. -Start Refresh eye drops bid for comfort.   -RTC for acute worsening.   Orders Placed This Encounter  Procedures  . POCT Influenza A/B   Meds ordered this encounter  Medications  . amoxicillin-clavulanate (AUGMENTIN) 875-125 MG tablet    Sig: Take 1 tablet by mouth 2 (two) times daily.    Dispense:  20 tablet    Refill:  0  . pseudoephedrine (SUDAFED) 120 MG 12 hr tablet    Sig: Take 1 tablet (120 mg total) by mouth 2 (two) times daily.    Dispense:  20 tablet    Refill:  0    No Follow-up on file.    Augustina Braddock Elayne Guerin, M.D. [facility] [address] [place], [place]  [phone] phone [phone]  fax

## 2015-11-06 ENCOUNTER — Other Ambulatory Visit: Payer: Self-pay | Admitting: Family Medicine

## 2015-11-06 NOTE — Telephone Encounter (Signed)
Pt hasn't been seen for these meds since last Aug, but has appt sched for Sept. OK to give 90 day RFs until then?

## 2015-11-08 NOTE — Telephone Encounter (Signed)
90 day supply of Synthroid and Lipitor provided.

## 2016-01-04 ENCOUNTER — Encounter: Payer: 59 | Admitting: Family Medicine

## 2016-01-07 ENCOUNTER — Encounter: Payer: 59 | Admitting: Family Medicine

## 2016-01-11 ENCOUNTER — Ambulatory Visit (INDEPENDENT_AMBULATORY_CARE_PROVIDER_SITE_OTHER): Payer: Medicare Other | Admitting: Family Medicine

## 2016-01-11 ENCOUNTER — Encounter: Payer: Self-pay | Admitting: Family Medicine

## 2016-01-11 VITALS — BP 126/70 | HR 52 | Temp 97.8°F | Resp 16 | Ht 68.0 in | Wt 185.6 lb

## 2016-01-11 DIAGNOSIS — B360 Pityriasis versicolor: Secondary | ICD-10-CM

## 2016-01-11 DIAGNOSIS — Z860100 Personal history of colon polyps, unspecified: Secondary | ICD-10-CM

## 2016-01-11 DIAGNOSIS — Z125 Encounter for screening for malignant neoplasm of prostate: Secondary | ICD-10-CM | POA: Diagnosis not present

## 2016-01-11 DIAGNOSIS — N400 Enlarged prostate without lower urinary tract symptoms: Secondary | ICD-10-CM

## 2016-01-11 DIAGNOSIS — Z87891 Personal history of nicotine dependence: Secondary | ICD-10-CM | POA: Diagnosis not present

## 2016-01-11 DIAGNOSIS — E038 Other specified hypothyroidism: Secondary | ICD-10-CM

## 2016-01-11 DIAGNOSIS — E034 Atrophy of thyroid (acquired): Secondary | ICD-10-CM

## 2016-01-11 DIAGNOSIS — E663 Overweight: Secondary | ICD-10-CM

## 2016-01-11 DIAGNOSIS — E785 Hyperlipidemia, unspecified: Secondary | ICD-10-CM | POA: Diagnosis not present

## 2016-01-11 DIAGNOSIS — Z23 Encounter for immunization: Secondary | ICD-10-CM

## 2016-01-11 DIAGNOSIS — Z0000 Encounter for general adult medical examination without abnormal findings: Secondary | ICD-10-CM

## 2016-01-11 LAB — CBC WITH DIFFERENTIAL/PLATELET
Basophils Absolute: 62 cells/uL (ref 0–200)
Basophils Relative: 1 %
EOS PCT: 6 %
Eosinophils Absolute: 372 cells/uL (ref 15–500)
HCT: 46.4 % (ref 38.5–50.0)
Hemoglobin: 16 g/dL (ref 13.2–17.1)
LYMPHS PCT: 29 %
Lymphs Abs: 1798 cells/uL (ref 850–3900)
MCH: 32.2 pg (ref 27.0–33.0)
MCHC: 34.5 g/dL (ref 32.0–36.0)
MCV: 93.4 fL (ref 80.0–100.0)
MPV: 10.6 fL (ref 7.5–12.5)
Monocytes Absolute: 558 cells/uL (ref 200–950)
Monocytes Relative: 9 %
NEUTROS PCT: 55 %
Neutro Abs: 3410 cells/uL (ref 1500–7800)
Platelets: 278 10*3/uL (ref 140–400)
RBC: 4.97 MIL/uL (ref 4.20–5.80)
RDW: 13.4 % (ref 11.0–15.0)
WBC: 6.2 10*3/uL (ref 3.8–10.8)

## 2016-01-11 LAB — TSH: TSH: 2.07 m[IU]/L (ref 0.40–4.50)

## 2016-01-11 LAB — POCT URINALYSIS DIP (MANUAL ENTRY)
BILIRUBIN UA: NEGATIVE
BILIRUBIN UA: NEGATIVE
Blood, UA: NEGATIVE
Glucose, UA: NEGATIVE
LEUKOCYTES UA: NEGATIVE
Nitrite, UA: NEGATIVE
PH UA: 5.5
Protein Ur, POC: NEGATIVE
SPEC GRAV UA: 1.02
Urobilinogen, UA: 0.2

## 2016-01-11 LAB — COMPREHENSIVE METABOLIC PANEL
ALT: 27 U/L (ref 9–46)
AST: 29 U/L (ref 10–35)
Albumin: 4.2 g/dL (ref 3.6–5.1)
Alkaline Phosphatase: 66 U/L (ref 40–115)
BILIRUBIN TOTAL: 1 mg/dL (ref 0.2–1.2)
BUN: 22 mg/dL (ref 7–25)
CO2: 27 mmol/L (ref 20–31)
CREATININE: 1.17 mg/dL (ref 0.70–1.25)
Calcium: 9.7 mg/dL (ref 8.6–10.3)
Chloride: 104 mmol/L (ref 98–110)
GLUCOSE: 106 mg/dL — AB (ref 65–99)
Potassium: 4.4 mmol/L (ref 3.5–5.3)
SODIUM: 141 mmol/L (ref 135–146)
Total Protein: 6.7 g/dL (ref 6.1–8.1)

## 2016-01-11 LAB — LIPID PANEL
Cholesterol: 192 mg/dL (ref 125–200)
HDL: 91 mg/dL (ref >=40)
LDL CALC: 84 mg/dL (ref <130)
Total CHOL/HDL Ratio: 2.1 Ratio (ref <=5.0)
Triglycerides: 87 mg/dL (ref <150)
VLDL: 17 mg/dL (ref <30)

## 2016-01-11 LAB — T4, FREE: Free T4: 1.1 ng/dL (ref 0.8–1.8)

## 2016-01-11 LAB — POC MICROSCOPIC URINALYSIS (UMFC): Mucus: ABSENT

## 2016-01-11 MED ORDER — KETOCONAZOLE 2 % EX SHAM: 1.0000 "application " | MEDICATED_SHAMPOO | CUTANEOUS | 0 refills | Status: DC

## 2016-01-11 MED ORDER — LEVOTHYROXINE SODIUM 100 MCG PO TABS: 100.0000 ug | ORAL_TABLET | Freq: Every day | ORAL | 3 refills | Status: DC

## 2016-01-11 MED ORDER — FLUCONAZOLE 150 MG PO TABS: 300.0000 mg | ORAL_TABLET | ORAL | 0 refills | Status: DC

## 2016-01-11 MED ORDER — ATORVASTATIN CALCIUM 20 MG PO TABS: ORAL_TABLET | ORAL | 3 refills | Status: DC

## 2016-01-11 NOTE — Patient Instructions (Addendum)
   IF you received an x-ray today, you will receive an invoice from [facility]. Please contact [facility] at [phone] with questions or concerns regarding your invoice.   IF you received labwork today, you will receive an invoice from [facility]. Please contact [facility] at [phone] with questions or concerns regarding your invoice.   Our billing staff will not be able to assist you with questions regarding bills from these companies.  You will be contacted with the lab results as soon as they are available. The fastest way to get your results is to activate your My Chart account. Instructions are located on the last page of this paperwork. If you have not heard from us regarding the results in 2 weeks, please contact this office.    Keeping you healthy  Get these tests  Blood pressure- Have your blood pressure checked once a year by your healthcare provider.  Normal blood pressure is 120/80  Weight- Have your body mass index (BMI) calculated to screen for obesity.  BMI is a measure of body fat based on height and weight. You can also calculate your own BMI at [email or web address].com/bmi/.  Cholesterol- Have your cholesterol checked every year.  Diabetes- Have your blood sugar checked regularly if you have high blood pressure, high cholesterol, have a family history of diabetes or if you are overweight.  Screening for Colon Cancer- Colonoscopy starting at age 50.  Screening may begin sooner depending on your family history and other health conditions. Follow up colonoscopy as directed by your Gastroenterologist.  Screening for Prostate Cancer- Both blood work (PSA) and a rectal exam help screen for Prostate Cancer.  Screening begins at age 40 with African-American men and at age 50 with Caucasian men.  Screening may begin sooner depending on your family history.  Take these medicines  Aspirin- One aspirin daily can help prevent Heart  disease and Stroke.  Flu shot- Every fall.  Tetanus- Every 10 years.  Zostavax- Once after the age of 60 to prevent Shingles.  Pneumonia shot- Once after the age of 65; if you are younger than 65, ask your healthcare provider if you need a Pneumonia shot.  Take these steps  Don't smoke- If you do smoke, talk to your doctor about quitting.  For tips on how to quit, go to www.smokefree.gov or call 1-800-QUIT-NOW.  Be physically active- Exercise 5 days a week for at least 30 minutes.  If you are not already physically active start slow and gradually work up to 30 minutes of moderate physical activity.  Examples of moderate activity include walking briskly, mowing the yard, dancing, swimming, bicycling, etc.  Eat a healthy diet- Eat a variety of healthy food such as fruits, vegetables, low fat milk, low fat cheese, yogurt, lean meant, poultry, fish, beans, tofu, etc. For more information go to www.thenutritionsource.org  Drink alcohol in moderation- Limit alcohol intake to less than two drinks a day. Never drink and drive.  Dentist- Brush and floss twice daily; visit your dentist twice a year.  Depression- Your emotional health is as important as your physical health. If you're feeling down, or losing interest in things you would normally enjoy please talk to your healthcare provider.  Eye exam- Visit your eye doctor every year.  Safe sex- If you may be exposed to a sexually transmitted infection, use a condom.  Seat belts- Seat belts can save your life; always wear one.  Smoke/Carbon Monoxide detectors- These detectors need to be installed on   the appropriate level of your home.  Replace batteries at least once a year.  Skin cancer- When out in the sun, cover up and use sunscreen 15 SPF or higher.  Violence- If anyone is threatening you, please tell your healthcare provider.  Living Will/ Health care power of attorney- Speak with your healthcare provider and family. 

## 2016-01-11 NOTE — Progress Notes (Signed)
Subjective:    Patient ID: Joshua Moreno, male    DOB: 1950/07/15, 65 y.o.   MRN: [id number]  01/11/2016  Annual Exam and Medication Refill (lipitor and synthroid)  By signing my name below, I, Judithe Moreno, attest that this documentation has been prepared under the direction and in the presence of Sonia Baller, MD. Electronically Signed: Judithe Moreno, ER Scribe. 01/11/2016. 9:03 AM.  Chief Complaint  Patient presents with  . Annual Exam  . Medication Refill    lipitor and synthroid    HPI HPI Comments: [name] is a 65 y.o. male with a past hx of BPH, HLD, and hypothyroidism who presents to [facility] reporting for a full physical. He is retired in May of this year, and has been married for 36 years. He is keeping busy. He quit smoking in 2005. He drinks 5 drinks per week. He is playing tennis four days per week. He has a living will. He wears his seatbelt. He does not text and drive. He takes an aspirin every other day. He is taking Lipitor and thyroid mediation as prescribed. He takes an OTC lithium and calcium supplement. I saw him for a physical on on 01/06/15. His colonoscopy was on 06/04/14. He is also here for BPH, HLD, hypothyroidism. He has undergone evaluation by a urologist six years ago for his BPH. He has not had any surgeries since our last visit. He saw his demonologist at [facility] last January for a regular checkup. His mother died at 45 of alzheimer's. His father died two months ago of natural causes. He has one sister and one brother. His brother has prediabetes.  He does not have any regular back pain. He does not have joint issues. He denies hearing loss, tinnitus, HA, sores in his mouth, dizziness, CP, palpitations, SOB, persistent cough, numbness, tingling, nausea, vomiting, abdominal pain, nocturia, constipation, diarrhea, GERD sx, burning sensation in extremities or neck swelling. He urine stream is slightly reduced. He occasionally has incomplete  emptying of his bladder. He has been on thyroid medication for 20 years. He has no family hx of prostate cancer. He usually goes to bed at 10pm and wakes up at 6am. He sleeps well and falls asleep easily. He states he is doing well emotionally. His wife has had a battle with cancer, but at the moment it is in full remission.   Has a recurrent hypopigmented rash along upper back. Similar rash several years ago. +itching.  +scaling.  Had to take a single pill and then go exercise with last episode.  Immunization History  Administered Date(s) Administered  . Influenza Split 05/26/2009  . Influenza,inj,Quad PF,36+ Mos 01/11/2016  . Pneumococcal Conjugate-13 01/06/2015  . Pneumococcal Polysaccharide-23 05/26/2009  . Tdap 11/30/2011  . Zoster 05/08/2009    Review of Systems  Constitutional: Positive for chills. Negative for activity change, appetite change, diaphoresis, fatigue, fever and unexpected weight change.  HENT: Negative for congestion, dental problem, drooling, ear discharge, ear pain, facial swelling, hearing loss, mouth sores, nosebleeds, postnasal drip, rhinorrhea, sinus pressure, sneezing, sore throat, tinnitus, trouble swallowing and voice change.   Eyes: Negative for photophobia, pain, discharge, redness, itching and visual disturbance.  Respiratory: Negative for apnea, cough, choking, chest tightness, shortness of breath, wheezing and stridor.   Cardiovascular: Negative for chest pain, palpitations and leg swelling.  Gastrointestinal: Negative for abdominal pain, blood in stool, constipation, diarrhea, nausea and vomiting.  Endocrine: Negative for cold intolerance, heat intolerance, polydipsia, polyphagia and polyuria.  Genitourinary: Negative for decreased urine volume, difficulty urinating, discharge, dysuria, enuresis, flank pain, frequency, genital sores, hematuria, penile pain, penile swelling, scrotal swelling, testicular pain and urgency.  Musculoskeletal: Negative for  arthralgias, back pain, gait problem, joint swelling, myalgias, neck pain and neck stiffness.  Skin: Positive for rash. Negative for color change, pallor and wound.  Allergic/Immunologic: Negative for environmental allergies, food allergies and immunocompromised state.  Neurological: Negative for dizziness, tremors, seizures, syncope, facial asymmetry, speech difficulty, weakness, light-headedness, numbness and headaches.  Hematological: Negative for adenopathy. Does not bruise/bleed easily.  Psychiatric/Behavioral: Negative for agitation, behavioral problems, confusion, decreased concentration, dysphoric mood, hallucinations, self-injury, sleep disturbance and suicidal ideas. The patient is not nervous/anxious and is not hyperactive.     Past Medical History:  Diagnosis Date  . Hyperlipidemia   . Personal history of colonic polyps-adenoma 06/26/2008  . Skin cancer of face    Actinic keratoses; followed annually by [facility]  . Thyroid disease    Past Surgical History:  Procedure Laterality Date  . HYDROCELE EXCISION / REPAIR     R scrotum  . tail bone     Allergies  Allergen Reactions  . Cinnamon Hives   Current Outpatient Prescriptions  Medication Sig Dispense Refill  . aspirin 81 MG tablet Take 81 mg by mouth daily. Do not take again until Jun 18, 2014    . atorvastatin (LIPITOR) 20 MG tablet TAKE 1 TABLET BY MOUTH  DAILY AT 6 PM 90 tablet 3  . levothyroxine (SYNTHROID, LEVOTHROID) 100 MCG tablet Take 1 tablet (100 mcg total) by mouth daily. 90 tablet 3  . LITHIUM PO Take 5 mg by mouth.    . pyridOXINE (VITAMIN B-6) 100 MG tablet Take 100 mg by mouth daily.    . fluconazole (DIFLUCAN) 150 MG tablet Take 2 tablets (300 mg total) by mouth once a week. For two weeks 4 tablet 0  . [START ON 01/13/2016] ketoconazole (NIZORAL) 2 % shampoo Apply 1 application topically 2 (two) times a week. 120 mL 0  . pseudoephedrine (SUDAFED) 120 MG 12 hr tablet Take 1 tablet (120 mg total)  by mouth 2 (two) times daily. (Patient not taking: Reported on 01/11/2016) 20 tablet 0   No current facility-administered medications for this visit.    Social History   Social History  . Marital status: Married    Spouse name: N/A  . Number of children: N/A  . Years of education: N/A   Occupational History  . retired    Social History Main Topics  . Smoking status: Former Research scientist (life sciences)  . Smokeless tobacco: Never Used  . Alcohol use 3.6 oz/week    6 Standard drinks or equivalent per week     Comment: 1-2 beers per week and a glass of wine  . Drug use: No  . Sexual activity: Yes   Other Topics Concern  . 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      Tobacco: quit in 2000.      Alcohol:  Beer or bourbon daily; 5 drinks per week.        Exercise:  Plays tennis four days weekly. [employer].      ADLs: independent.      Advanced Directives:  FULL CODE; no prolonged resuscitation.        Seatbelt: 100%; no texting while driving.           Family History  Problem Relation Age of Onset  . Heart disease Father 28    CABG/CAD  . Alzheimer's disease Mother   . Colon cancer Neg Hx   . Esophageal cancer Neg Hx   . Rectal cancer Neg Hx   . Stomach cancer Neg Hx        Objective:    BP 126/70 (BP Location: Left Arm, Patient Position: Sitting, Cuff Size: Normal)   Pulse (!) 52   Temp 97.8 F (36.6 C) (Oral)   Resp 16   Ht 5\' 8"  (1.727 m)   Wt 185 lb 9.6 oz (84.2 kg)   SpO2 97%   BMI 28.22 kg/m  Physical Exam  Constitutional: He is oriented to person, place, and time. He appears well-developed and well-nourished. No distress.  HENT:  Head: Normocephalic and atraumatic.  Right Ear: External ear normal.  Left Ear: External ear normal.  Nose: Nose normal.  Mouth/Throat: Oropharynx is clear and  moist.  Eyes: Conjunctivae and EOM are normal. Pupils are equal, round, and reactive to light.  Neck: Normal range of motion. Neck supple. Carotid bruit is not present. No thyromegaly present.  Cardiovascular: Normal rate, regular rhythm, normal heart sounds and intact distal pulses.  Exam reveals no gallop and no friction rub.   No murmur heard. Pulmonary/Chest: Effort normal and breath sounds normal. No respiratory distress. He has no wheezes. He has no rales.  Abdominal: Soft. Bowel sounds are normal. He exhibits no distension and no mass. There is no tenderness. There is no rebound and no guarding. Hernia confirmed negative in the right inguinal area and confirmed negative in the left inguinal area.  Genitourinary: Rectum normal, testes normal and penis normal. Prostate is enlarged. Circumcised.  Musculoskeletal: Normal range of motion.       Right shoulder: Normal.       Left shoulder: Normal.       Cervical back: Normal.  Lymphadenopathy:    He has no cervical adenopathy.       Right: No inguinal adenopathy present.       Left: No inguinal adenopathy present.  Neurological: He is alert and oriented to person, place, and time. He has normal reflexes. No cranial nerve deficit. He exhibits normal muscle tone. Coordination normal.  Skin: Skin is warm and dry. Rash noted. He is not diaphoretic. No pallor.  Hypopigmented scaling rash upper back diffusely.    Psychiatric: He has a normal mood and affect. His behavior is normal. Judgment and thought content normal.  Nursing note and vitals reviewed.  Depression screen [facility] 2/9 01/11/2016 07/01/2015 01/06/2015  Decreased Interest 0 0 0  Down, Depressed, Hopeless 0 0 0  PHQ - 2 Score 0 0 0   Fall Risk  01/11/2016 01/06/2015  Falls in the past year? Yes No  Number falls in past yr: 1 -  Injury with Fall? No -  Functional Status Survey: Is the patient deaf or have difficulty hearing?: No Does the patient have difficulty seeing, even when wearing  glasses/contacts?: No Does the patient have difficulty concentrating, remembering, or making decisions?: No Does the patient have difficulty walking or climbing stairs?: No Does the patient have difficulty dressing or bathing?: No Does the patient have difficulty doing errands alone such as visiting a doctor's office or shopping?: No  Assessment & Plan:   1. Encounter for Medicare annual wellness exam   2. Hypothyroidism due to acquired atrophy of thyroid   3. Hyperlipidemia   4. Overweight (BMI 25.0-29.9)   5. History of tobacco use   6. History of colonic polyps   7. BPH (benign prostatic hyperplasia)   8. Screening for prostate cancer   9. Tinea versicolor   10. Need for prophylactic vaccination and inoculation against influenza    -anticipatory guidance. -no evidence of depression, falls, or decreased functional capacity. -has advanced directives; desires FULL CODE. -independent with ADLs. -obtain labs related to chronic medical conditions; refills provided. -rx for Diflucan and Ketoconazole shampoo provided for tinea versicolor.   Orders Placed This Encounter  Procedures  . Flu Vaccine QUAD 36+ mos IM  . CBC with Differential/Platelet  . Comprehensive metabolic panel    Order Specific Question:   Has the patient fasted?    Answer:   Yes  . Lipid panel    Order Specific Question:   Has the patient fasted?    Answer:   Yes  . TSH  . T4, free  . PSA, Medicare  . POCT urinalysis dipstick  . POCT Microscopic Urinalysis ([facility])   Meds ordered this encounter  Medications  . atorvastatin (LIPITOR) 20 MG tablet    Sig: TAKE 1 TABLET BY MOUTH  DAILY AT 6 PM    Dispense:  90 tablet    Refill:  3  . levothyroxine (SYNTHROID, LEVOTHROID) 100 MCG tablet    Sig: Take 1 tablet (100 mcg total) by mouth daily.    Dispense:  90 tablet    Refill:  3  . ketoconazole (NIZORAL) 2 % shampoo    Sig: Apply 1 application topically 2 (two) times a week.    Dispense:  120 mL     Refill:  0  . fluconazole (DIFLUCAN) 150 MG tablet    Sig: Take 2 tablets (300 mg total) by mouth once a week. For two weeks    Dispense:  4 tablet    Refill:  0    Return in about 1 year (around 01/10/2017) for complete physical examiniation.   I personally performed the services described in this documentation, which was scribed in my presence. The recorded information has been reviewed and considered.  Asim Gersten Elayne Guerin, M.D. [facility] [address] [place], [place]  [address] [phone] phone [phone] fax

## 2016-01-20 ENCOUNTER — Telehealth: Payer: Self-pay

## 2016-01-20 NOTE — Telephone Encounter (Signed)
Pt was seen on:

## 2016-06-06 DIAGNOSIS — L821 Other seborrheic keratosis: Secondary | ICD-10-CM | POA: Diagnosis not present

## 2016-06-06 DIAGNOSIS — D2261 Melanocytic nevi of right upper limb, including shoulder: Secondary | ICD-10-CM | POA: Diagnosis not present

## 2016-06-06 DIAGNOSIS — D2271 Melanocytic nevi of right lower limb, including hip: Secondary | ICD-10-CM | POA: Diagnosis not present

## 2016-06-06 DIAGNOSIS — L814 Other melanin hyperpigmentation: Secondary | ICD-10-CM | POA: Diagnosis not present

## 2016-06-06 DIAGNOSIS — L570 Actinic keratosis: Secondary | ICD-10-CM | POA: Diagnosis not present

## 2016-06-06 DIAGNOSIS — D225 Melanocytic nevi of trunk: Secondary | ICD-10-CM | POA: Diagnosis not present

## 2016-06-06 DIAGNOSIS — D2262 Melanocytic nevi of left upper limb, including shoulder: Secondary | ICD-10-CM | POA: Diagnosis not present

## 2016-06-06 DIAGNOSIS — D224 Melanocytic nevi of scalp and neck: Secondary | ICD-10-CM | POA: Diagnosis not present

## 2017-01-23 ENCOUNTER — Telehealth: Payer: Self-pay | Admitting: Family Medicine

## 2017-01-23 MED ORDER — LEVOTHYROXINE SODIUM 100 MCG PO TABS: 100.0000 ug | ORAL_TABLET | Freq: Every day | ORAL | 0 refills | Status: DC

## 2017-01-23 NOTE — Telephone Encounter (Signed)
Pt is needing a refill on his synthroid meds   Best number [phone]

## 2017-01-23 NOTE — Telephone Encounter (Signed)
30 day supply sent to [facility]. Patient has an apt on 02/21/17 and was advised that he will not get further refills if he does not come to his apt. Patient verbalized understanding

## 2017-02-02 ENCOUNTER — Telehealth: Payer: Self-pay

## 2017-02-02 NOTE — Telephone Encounter (Signed)
Called pt to schedule Medicare Annual Wellness Visit before upcoming physical with pcp.  -nr   Josepha Pigg, B.A.  Care Guide [phone]

## 2017-02-13 ENCOUNTER — Ambulatory Visit (INDEPENDENT_AMBULATORY_CARE_PROVIDER_SITE_OTHER): Payer: Medicare Other

## 2017-02-13 VITALS — BP 138/84 | HR 52 | Ht 68.0 in | Wt 185.2 lb

## 2017-02-13 DIAGNOSIS — E785 Hyperlipidemia, unspecified: Secondary | ICD-10-CM | POA: Diagnosis not present

## 2017-02-13 DIAGNOSIS — Z0000 Encounter for general adult medical examination without abnormal findings: Secondary | ICD-10-CM

## 2017-02-13 DIAGNOSIS — Z87438 Personal history of other diseases of male genital organs: Secondary | ICD-10-CM | POA: Diagnosis not present

## 2017-02-13 DIAGNOSIS — Z23 Encounter for immunization: Secondary | ICD-10-CM | POA: Diagnosis not present

## 2017-02-13 NOTE — Progress Notes (Signed)
Subjective:   Joshua Moreno is a 66 y.o. male who presents for Medicare Annual/Subsequent preventive examination.  Review of Systems:  N/A Cardiac Risk Factors include: advanced age (>14men, >76 women);dyslipidemia;male gender;sedentary lifestyle     Objective:    Vitals: BP 138/84   Pulse (!) 52   Ht 5\' 8"  (1.727 m)   Wt 185 lb 4 oz (84 kg)   SpO2 98%   BMI 28.17 kg/m   Body mass index is 28.17 kg/m.  Tobacco History  Smoking Status  . Former Smoker  Smokeless Tobacco  . Never Used     Counseling given: Not Answered   Past Medical History:  Diagnosis Date  . Hyperlipidemia   . Personal history of colonic polyps-adenoma 06/26/2008  . Skin cancer of face    Actinic keratoses; followed annually by [facility]  . Thyroid disease    Past Surgical History:  Procedure Laterality Date  . HYDROCELE EXCISION / REPAIR     R scrotum  . tail bone     Family History  Problem Relation Age of Onset  . Heart disease Father 60       CABG/CAD  . Alzheimer's disease Mother   . Colon cancer Neg Hx   . Esophageal cancer Neg Hx   . Rectal cancer Neg Hx   . Stomach cancer Neg Hx    History  Sexual Activity  . Sexual activity: Yes    Outpatient Encounter Prescriptions as of 02/13/2017  Medication Sig  . aspirin 81 MG tablet Take 81 mg by mouth daily. Do not take again until Jun 18, 2014  . atorvastatin (LIPITOR) 20 MG tablet TAKE 1 TABLET BY MOUTH  DAILY AT 6 PM  . levothyroxine (SYNTHROID, LEVOTHROID) 100 MCG tablet Take 1 tablet (100 mcg total) by mouth daily.  Marland Kitchen LITHIUM PO Take 5 mg by mouth.  . pyridOXINE (VITAMIN B-6) 100 MG tablet Take 100 mg by mouth daily.  . [DISCONTINUED] pseudoephedrine (SUDAFED) 120 MG 12 hr tablet Take 1 tablet (120 mg total) by mouth 2 (two) times daily.  . [DISCONTINUED] fluconazole (DIFLUCAN) 150 MG tablet Take 2 tablets (300 mg total) by mouth once a week. For two weeks  . [DISCONTINUED] ketoconazole (NIZORAL) 2 % shampoo Apply  1 application topically 2 (two) times a week.   No facility-administered encounter medications on file as of 02/13/2017.     Activities of Daily Living In your present state of health, do you have any difficulty performing the following activities: 02/13/2017  Hearing? N  Vision? N  Difficulty concentrating or making decisions? N  Walking or climbing stairs? N  Dressing or bathing? N  Doing errands, shopping? N  Preparing Food and eating ? N  Using the Toilet? N  In the past six months, have you accidently leaked urine? N  Do you have problems with loss of bowel control? N  Managing your Medications? N  Managing your Finances? N  Housekeeping or managing your Housekeeping? N  Some recent data might be hidden    Patient Care Team: Wardell Honour, MD as PCP - General (Family Medicine) [place], Heather, OD [facility])   Assessment:     Exercise Activities and Dietary recommendations Current Exercise Habits: Home exercise routine, Type of exercise: walking (plays tennis ), Time (Minutes): > 60, Frequency (Times/Week): 5, Weekly Exercise (Minutes/Week): 0, Intensity: Intense, Exercise limited by: None identified  Goals    . Weight (lb) < 180 lb (81.6 kg)  Patient states that he wants to try to lose 5 lbs and start increasing his water intake.       Fall Risk Fall Risk  02/13/2017 01/11/2016 01/06/2015  Falls in the past year? Yes Yes No  Number falls in past yr: 1 1 -  Injury with Fall? Yes No -  Comment concussion  - -  Follow up Falls prevention discussed - -   Depression Screen PHQ 2/9 Scores 02/13/2017 01/11/2016 07/01/2015 01/06/2015  PHQ - 2 Score 0 0 0 0    Cognitive Function     6CIT Screen 02/13/2017  What Year? 0 points  What month? 0 points  What time? 0 points  Count back from 20 0 points  Months in reverse 0 points  Repeat phrase 2 points  Total Score 2    Immunization History  Administered Date(s) Administered  . Influenza Split 05/26/2009  .  Influenza,inj,Quad PF,6+ Mos 01/11/2016, 02/13/2017  . Pneumococcal Conjugate-13 01/06/2015  . Pneumococcal Polysaccharide-23 05/26/2009, 02/13/2017  . Tdap 11/30/2011  . Zoster 05/08/2009   Screening Tests Health Maintenance  Topic Date Due  . COLONOSCOPY  04/12/2018  . TETANUS/TDAP  11/29/2021  . INFLUENZA VACCINE  Completed  . Hepatitis C Screening  Completed  . PNA vac Low Risk Adult  Completed      Plan:   I have personally reviewed and noted the following in the patient's chart:   . Medical and social history . Use of alcohol, tobacco or illicit drugs  . Current medications and supplements . Functional ability and status . Nutritional status . Physical activity . Advanced directives . List of other physicians . Hospitalizations, surgeries, and ER visits in previous 12 months . Vitals . Screenings to include cognitive, depression, and falls . Referrals and appointments  In addition, I have reviewed and discussed with patient certain preventive protocols, quality metrics, and best practice recommendations. A written personalized care plan for preventive services as well as general preventive health recommendations were provided to patient.  Blood work ordered.   Andrez Grime, LPN  16/05/958

## 2017-02-13 NOTE — Patient Instructions (Addendum)
[name] , Thank you for taking time to come for your Medicare Wellness Visit. I appreciate your ongoing commitment to your health goals. Please review the following plan we discussed and let me know if I can assist you in the future.   Screening recommendations/referrals: Colonoscopy: up to date, next due 04/12/2025 Recommended yearly ophthalmology/optometry visit for glaucoma screening and checkup Recommended yearly dental visit for hygiene and checkup  Vaccinations: Influenza vaccine: administered today  Pneumococcal vaccine: administered today  Tdap vaccine: up to date, next due 11/29/2021 Shingles vaccine: up to date    Advanced directives: Please bring a copy of your POA (Power of [place]) and/or Living Will to your next appointment.   Conditions/risks identified: Try to lose 5 lbs and start increasing your water intake.   Next appointment: 02/21/17 @ 9:40 am with Dr. Tamala Julian   Preventive Care 65 Years and Older, Male Preventive care refers to lifestyle choices and visits with your health care provider that can promote health and wellness. What does preventive care include?  A yearly physical exam. This is also called an annual well check.  Dental exams once or twice a year.  Routine eye exams. Ask your health care provider how often you should have your eyes checked.  Personal lifestyle choices, including:  Daily care of your teeth and gums.  Regular physical activity.  Eating a healthy diet.  Avoiding tobacco and drug use.  Limiting alcohol use.  Practicing safe sex.  Taking low doses of aspirin every day.  Taking vitamin and mineral supplements as recommended by your health care provider. What happens during an annual well check? The services and screenings done by your health care provider during your annual well check will depend on your age, overall health, lifestyle risk factors, and family history of disease. Counseling  Your health care provider may ask  you questions about your:  Alcohol use.  Tobacco use.  Drug use.  Emotional well-being.  Home and relationship well-being.  Sexual activity.  Eating habits.  History of falls.  Memory and ability to understand (cognition).  Work and work Statistician. Screening  You may have the following tests or measurements:  Height, weight, and BMI.  Blood pressure.  Lipid and cholesterol levels. These may be checked every 5 years, or more frequently if you are over 110 years old.  Skin check.  Lung cancer screening. You may have this screening every year starting at age 71 if you have a 30-pack-year history of smoking and currently smoke or have quit within the past 15 years.  Fecal occult blood test (FOBT) of the stool. You may have this test every year starting at age 20.  Flexible sigmoidoscopy or colonoscopy. You may have a sigmoidoscopy every 5 years or a colonoscopy every 10 years starting at age 9.  Prostate cancer screening. Recommendations will vary depending on your family history and other risks.  Hepatitis C blood test.  Hepatitis B blood test.  Sexually transmitted disease (STD) testing.  Diabetes screening. This is done by checking your blood sugar (glucose) after you have not eaten for a while (fasting). You may have this done every 1-3 years.  Abdominal aortic aneurysm (AAA) screening. You may need this if you are a current or former smoker.  Osteoporosis. You may be screened starting at age 49 if you are at high risk. Talk with your health care provider about your test results, treatment options, and if necessary, the need for more tests. Vaccines  Your health care  provider may recommend certain vaccines, such as:  Influenza vaccine. This is recommended every year.  Tetanus, diphtheria, and acellular pertussis (Tdap, Td) vaccine. You may need a Td booster every 10 years.  Zoster vaccine. You may need this after age 27.  Pneumococcal 13-valent conjugate  (PCV13) vaccine. One dose is recommended after age 54.  Pneumococcal polysaccharide (PPSV23) vaccine. One dose is recommended after age 54. Talk to your health care provider about which screenings and vaccines you need and how often you need them. This information is not intended to replace advice given to you by your health care provider. Make sure you discuss any questions you have with your health care provider. Document Released: 05/21/2015 Document Revised: 01/12/2016 Document Reviewed: 02/23/2015 Elsevier Interactive Patient Education  2017 [place] Prevention in the Home Falls can cause injuries. They can happen to people of all ages. There are many things you can do to make your home safe and to help prevent falls. What can I do on the outside of my home?  Regularly fix the edges of walkways and driveways and fix any cracks.  Remove anything that might make you trip as you walk through a door, such as a raised step or threshold.  Trim any bushes or trees on the path to your home.  Use bright outdoor lighting.  Clear any walking paths of anything that might make someone trip, such as rocks or tools.  Regularly check to see if handrails are loose or broken. Make sure that both sides of any steps have handrails.  Any raised decks and porches should have guardrails on the edges.  Have any leaves, snow, or ice cleared regularly.  Use sand or salt on walking paths during winter.  Clean up any spills in your garage right away. This includes oil or grease spills. What can I do in the bathroom?  Use night lights.  Install grab bars by the toilet and in the tub and shower. Do not use towel bars as grab bars.  Use non-skid mats or decals in the tub or shower.  If you need to sit down in the shower, use a plastic, non-slip stool.  Keep the floor dry. Clean up any water that spills on the floor as soon as it happens.  Remove soap buildup in the tub or shower  regularly.  Attach bath mats securely with double-sided non-slip rug tape.  Do not have throw rugs and other things on the floor that can make you trip. What can I do in the bedroom?  Use night lights.  Make sure that you have a light by your bed that is easy to reach.  Do not use any sheets or blankets that are too big for your bed. They should not hang down onto the floor.  Have a firm chair that has side arms. You can use this for support while you get dressed.  Do not have throw rugs and other things on the floor that can make you trip. What can I do in the kitchen?  Clean up any spills right away.  Avoid walking on wet floors.  Keep items that you use a lot in easy-to-reach places.  If you need to reach something above you, use a strong step stool that has a grab bar.  Keep electrical cords out of the way.  Do not use floor polish or wax that makes floors slippery. If you must use wax, use non-skid floor wax.  Do not have throw  rugs and other things on the floor that can make you trip. What can I do with my stairs?  Do not leave any items on the stairs.  Make sure that there are handrails on both sides of the stairs and use them. Fix handrails that are broken or loose. Make sure that handrails are as long as the stairways.  Check any carpeting to make sure that it is firmly attached to the stairs. Fix any carpet that is loose or worn.  Avoid having throw rugs at the top or bottom of the stairs. If you do have throw rugs, attach them to the floor with carpet tape.  Make sure that you have a light switch at the top of the stairs and the bottom of the stairs. If you do not have them, ask someone to add them for you. What else can I do to help prevent falls?  Wear shoes that:  Do not have high heels.  Have rubber bottoms.  Are comfortable and fit you well.  Are closed at the toe. Do not wear sandals.  If you use a stepladder:  Make sure that it is fully  opened. Do not climb a closed stepladder.  Make sure that both sides of the stepladder are locked into place.  Ask someone to hold it for you, if possible.  Clearly mark and make sure that you can see:  Any grab bars or handrails.  First and last steps.  Where the edge of each step is.  Use tools that help you move around (mobility aids) if they are needed. These include:  Canes.  Walkers.  Scooters.  Crutches.  Turn on the lights when you go into a dark area. Replace any light bulbs as soon as they burn out.  Set up your furniture so you have a clear path. Avoid moving your furniture around.  If any of your floors are uneven, fix them.  If there are any pets around you, be aware of where they are.  Review your medicines with your doctor. Some medicines can make you feel dizzy. This can increase your chance of falling. Ask your doctor what other things that you can do to help prevent falls. This information is not intended to replace advice given to you by your health care provider. Make sure you discuss any questions you have with your health care provider. Document Released: 02/18/2009 Document Revised: 09/30/2015 Document Reviewed: 05/29/2014 Elsevier Interactive Patient Education  2017 [employer].

## 2017-02-14 LAB — URINALYSIS, COMPLETE
BILIRUBIN UA: NEGATIVE
Glucose, UA: NEGATIVE
KETONES UA: NEGATIVE
LEUKOCYTES UA: NEGATIVE
Nitrite, UA: NEGATIVE
PROTEIN UA: NEGATIVE
RBC UA: NEGATIVE
Specific Gravity, UA: 1.022 (ref 1.005–1.030)
UUROB: 0.2 mg/dL (ref 0.2–1.0)
pH, UA: 7.5 (ref 5.0–7.5)

## 2017-02-14 LAB — COMPREHENSIVE METABOLIC PANEL
A/G RATIO: 2.1 (ref 1.2–2.2)
ALK PHOS: 74 IU/L (ref 39–117)
ALT: 28 IU/L (ref 0–44)
AST: 26 IU/L (ref 0–40)
Albumin: 4.2 g/dL (ref 3.6–4.8)
BILIRUBIN TOTAL: 0.5 mg/dL (ref 0.0–1.2)
BUN / CREAT RATIO: 15 (ref 10–24)
BUN: 18 mg/dL (ref 8–27)
CHLORIDE: 107 mmol/L — AB (ref 96–106)
CO2: 26 mmol/L (ref 20–29)
CREATININE: 1.21 mg/dL (ref 0.76–1.27)
Calcium: 9.9 mg/dL (ref 8.6–10.2)
GFR calc Af Amer: 71 mL/min/{1.73_m2} (ref >59)
GFR calc non Af Amer: 62 mL/min/{1.73_m2} (ref >59)
GLOBULIN, TOTAL: 2 g/dL (ref 1.5–4.5)
Glucose: 108 mg/dL — ABNORMAL HIGH (ref 65–99)
POTASSIUM: 5.4 mmol/L — AB (ref 3.5–5.2)
SODIUM: 146 mmol/L — AB (ref 134–144)
Total Protein: 6.2 g/dL (ref 6.0–8.5)

## 2017-02-14 LAB — MICROSCOPIC EXAMINATION
CASTS: NONE SEEN /LPF
EPITHELIAL CELLS (NON RENAL): NONE SEEN /HPF (ref 0–10)

## 2017-02-14 LAB — LIPID PANEL
CHOLESTEROL TOTAL: 168 mg/dL (ref 100–199)
Chol/HDL Ratio: 2.7 ratio (ref 0.0–5.0)
HDL: 62 mg/dL (ref >39)
LDL CALC: 92 mg/dL (ref 0–99)
Triglycerides: 72 mg/dL (ref 0–149)
VLDL CHOLESTEROL CAL: 14 mg/dL (ref 5–40)

## 2017-02-14 LAB — CBC WITH DIFFERENTIAL/PLATELET
BASOS ABS: 0.1 10*3/uL (ref 0.0–0.2)
Basos: 1 %
EOS (ABSOLUTE): 0.2 10*3/uL (ref 0.0–0.4)
Eos: 4 %
Hematocrit: 44.9 % (ref 37.5–51.0)
Hemoglobin: 14.7 g/dL (ref 13.0–17.7)
IMMATURE GRANULOCYTES: 0 %
Immature Grans (Abs): 0 10*3/uL (ref 0.0–0.1)
LYMPHS ABS: 1.7 10*3/uL (ref 0.7–3.1)
Lymphs: 25 %
MCH: 31.6 pg (ref 26.6–33.0)
MCHC: 32.7 g/dL (ref 31.5–35.7)
MCV: 97 fL (ref 79–97)
MONOS ABS: 0.6 10*3/uL (ref 0.1–0.9)
Monocytes: 9 %
NEUTROS PCT: 61 %
Neutrophils Absolute: 4.1 10*3/uL (ref 1.4–7.0)
PLATELETS: 265 10*3/uL (ref 150–379)
RBC: 4.65 x10E6/uL (ref 4.14–5.80)
RDW: 13.1 % (ref 12.3–15.4)
WBC: 6.7 10*3/uL (ref 3.4–10.8)

## 2017-02-21 ENCOUNTER — Encounter: Payer: Self-pay | Admitting: Family Medicine

## 2017-02-21 ENCOUNTER — Ambulatory Visit (INDEPENDENT_AMBULATORY_CARE_PROVIDER_SITE_OTHER): Payer: Medicare Other | Admitting: Family Medicine

## 2017-02-21 VITALS — BP 118/72 | HR 59 | Temp 98.0°F | Resp 16 | Ht 68.5 in | Wt 189.0 lb

## 2017-02-21 DIAGNOSIS — N401 Enlarged prostate with lower urinary tract symptoms: Secondary | ICD-10-CM

## 2017-02-21 DIAGNOSIS — E785 Hyperlipidemia, unspecified: Secondary | ICD-10-CM

## 2017-02-21 DIAGNOSIS — I4510 Unspecified right bundle-branch block: Secondary | ICD-10-CM

## 2017-02-21 DIAGNOSIS — E875 Hyperkalemia: Secondary | ICD-10-CM

## 2017-02-21 DIAGNOSIS — R252 Cramp and spasm: Secondary | ICD-10-CM | POA: Diagnosis not present

## 2017-02-21 DIAGNOSIS — R739 Hyperglycemia, unspecified: Secondary | ICD-10-CM | POA: Diagnosis not present

## 2017-02-21 DIAGNOSIS — Z86010 Personal history of colonic polyps: Secondary | ICD-10-CM | POA: Diagnosis not present

## 2017-02-21 DIAGNOSIS — E034 Atrophy of thyroid (acquired): Secondary | ICD-10-CM

## 2017-02-21 DIAGNOSIS — R351 Nocturia: Secondary | ICD-10-CM

## 2017-02-21 LAB — POCT URINALYSIS DIP (MANUAL ENTRY)
BILIRUBIN UA: NEGATIVE
BILIRUBIN UA: NEGATIVE mg/dL
Blood, UA: NEGATIVE
GLUCOSE UA: NEGATIVE mg/dL
LEUKOCYTES UA: NEGATIVE
Nitrite, UA: NEGATIVE
PROTEIN UA: NEGATIVE mg/dL
SPEC GRAV UA: 1.015 (ref 1.010–1.025)
Urobilinogen, UA: 0.2 E.U./dL
pH, UA: 7 (ref 5.0–8.0)

## 2017-02-21 MED ORDER — ZOSTER VAC RECOMB ADJUVANTED 50 MCG/0.5ML IM SUSR: 0.5000 mL | Freq: Once | INTRAMUSCULAR | 1 refills | Status: AC

## 2017-02-21 MED ORDER — ATORVASTATIN CALCIUM 20 MG PO TABS: ORAL_TABLET | ORAL | 3 refills | Status: DC

## 2017-02-21 MED ORDER — LEVOTHYROXINE SODIUM 100 MCG PO TABS
100.0000 ug | ORAL_TABLET | Freq: Every day | ORAL | 3 refills | Status: DC
Start: 2017-02-21 — End: 2017-03-12

## 2017-02-21 NOTE — Progress Notes (Signed)
Subjective:    Patient ID: Joshua Moreno, male    DOB: 02/10/1951, 66 y.o.   MRN: [id number]  02/21/2017  Annual Exam    HPI This 66 y.o. male presents for follow-up of chronic medical conditions.  Completed AWV on 02/13/17 with LPN Calandra.   Last physical:  02-13-17 Colonoscopy:  04-23-15 PSA:  01-06-15 Eye exam:    2016  ; no major issues. Dental exam:  Every year.    Falls: has fallen twice; during tennis.  Hit head really hard; last fall one year ago.  Concussion likley.  Then last week with power off, went to help neighbor; caught foot on cement; fell forward.  [place] forward; scratched head again.    BPH: followed by urology six years ago.  Hypercholesterolemia: Patient reports good compliance with medication, good tolerance to medication, and good symptom control.    Hypothyroidism: Patient reports good compliance with medication, good tolerance to medication, and good symptom control.    Leg cramps: severe when plays tennis and then also does yard work.  Has been taking 3-9 potassium pills on days that plays tennis.   Drinks zero sport drink.   Will drink half gallon when playing tennis.  With yard work, drinks one gallon.      BP Readings from Last 3 Encounters:  02/21/17 118/72  02/13/17 138/84  01/11/16 126/70   Wt Readings from Last 3 Encounters:  02/21/17 189 lb (85.7 kg)  02/13/17 185 lb 4 oz (84 kg)  01/11/16 185 lb 9.6 oz (84.2 kg)   Immunization History  Administered Date(s) Administered  . Influenza Split 05/26/2009  . Influenza,inj,Quad PF,6+ Mos 01/11/2016, 02/13/2017  . Pneumococcal Conjugate-13 01/06/2015  . Pneumococcal Polysaccharide-23 05/26/2009, 02/13/2017  . Tdap 11/30/2011  . Zoster 05/08/2009    Review of Systems  Constitutional: Negative for activity change, appetite change, chills, diaphoresis, fatigue, fever and unexpected weight change.  HENT: Negative for congestion, dental problem, drooling, ear discharge, ear pain, facial  swelling, hearing loss, mouth sores, nosebleeds, postnasal drip, rhinorrhea, sinus pressure, sneezing, sore throat, tinnitus, trouble swallowing and voice change.   Eyes: Negative for photophobia, pain, discharge, redness, itching and visual disturbance.  Respiratory: Negative for apnea, cough, choking, chest tightness, shortness of breath, wheezing and stridor.   Cardiovascular: Negative for chest pain, palpitations and leg swelling.  Gastrointestinal: Negative for abdominal pain, blood in stool, constipation, diarrhea, nausea and vomiting.  Endocrine: Negative for cold intolerance, heat intolerance, polydipsia, polyphagia and polyuria.  Genitourinary: Negative for decreased urine volume, difficulty urinating, discharge, dysuria, enuresis, flank pain, frequency, genital sores, hematuria, penile pain, penile swelling, scrotal swelling, testicular pain and urgency.       Nocturia x 0.  Strong stream.  No ED; good sex drive.  Musculoskeletal: Negative for arthralgias, back pain, gait problem, joint swelling, myalgias, neck pain and neck stiffness.  Skin: Negative for color change, pallor, rash and wound.  Allergic/Immunologic: Negative for environmental allergies, food allergies and immunocompromised state.  Neurological: Negative for dizziness, tremors, seizures, syncope, facial asymmetry, speech difficulty, weakness, light-headedness, numbness and headaches.  Hematological: Negative for adenopathy. Does not bruise/bleed easily.  Psychiatric/Behavioral: Negative for agitation, behavioral problems, confusion, decreased concentration, dysphoric mood, hallucinations, self-injury, sleep disturbance and suicidal ideas. The patient is not nervous/anxious and is not hyperactive.        Bedtime 10:30; wakes up 6:30am.      Past Medical History:  Diagnosis Date  . Hyperlipidemia   . Personal history of colonic polyps-adenoma 06/26/2008  .  Skin cancer of face    Actinic keratoses; followed annually by  [facility]  . Thyroid disease    Past Surgical History:  Procedure Laterality Date  . HYDROCELE EXCISION / REPAIR     R scrotum  . tail bone     Allergies  Allergen Reactions  . Cinnamon Hives   Current Outpatient Prescriptions on File Prior to Visit  Medication Sig Dispense Refill  . aspirin 81 MG tablet Take 81 mg by mouth daily. Do not take again until Jun 18, 2014    . LITHIUM PO Take 5 mg by mouth.    . pyridOXINE (VITAMIN B-6) 100 MG tablet Take 100 mg by mouth daily.     No current facility-administered medications on file prior to visit.    Social History   Social History  . Marital status: Married    Spouse name: N/A  . Number of children: N/A  . Years of education: N/A   Occupational History  . retired    Social History Main Topics  . Smoking status: Former Research scientist (life sciences)  . Smokeless tobacco: Never Used  . Alcohol use 7.8 oz/week    6 Standard drinks or equivalent, 6 Cans of beer, 1 Shots of liquor per week  . Drug use: No  . Sexual activity: Yes   Other Topics Concern  . 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      Tobacco: quit in 2000.  1 ppd x 30 years.      Alcohol:  Beer or bourbon daily; 5-8 drinks per week.        Exercise:  Plays tennis four days weekly. [employer].      ADLs: independent. Drives.      Advanced Directives: FULL CODE; no prolonged resuscitation.        Seatbelt: 100%; no texting while driving.           Family History  Problem Relation Age of Onset  . Heart disease Father 57       CABG/CAD  . Alzheimer's disease Mother   . Colon cancer Neg Hx   . Esophageal cancer Neg Hx   . Rectal cancer Neg Hx   . Stomach cancer Neg Hx        Objective:    BP 118/72   Pulse (!) 59   Temp 98 F (36.7 C) (Rectal)   Resp 16   Ht 5' 8.5"  (1.74 m)   Wt 189 lb (85.7 kg)   SpO2 98%   BMI 28.32 kg/m  Physical Exam  Constitutional: He is oriented to person, place, and time. He appears well-developed and well-nourished. No distress.  HENT:  Head: Normocephalic and atraumatic.  Right Ear: External ear normal.  Left Ear: External ear normal.  Nose: Nose normal.  Mouth/Throat: Oropharynx is clear and moist.  Eyes: Pupils are equal, round, and reactive to light. Conjunctivae and EOM are normal.  Neck: Normal range of motion. Neck supple. Carotid bruit is not present. No thyromegaly present.  Cardiovascular: Normal rate, regular rhythm, normal heart sounds and intact distal pulses.  Exam reveals no gallop and no friction rub.   No murmur heard. Pulmonary/Chest: Effort normal and breath sounds normal. He has no  wheezes. He has no rales.  Abdominal: Soft. Bowel sounds are normal. He exhibits no distension and no mass. There is no tenderness. There is no rebound and no guarding. Hernia confirmed negative in the right inguinal area and confirmed negative in the left inguinal area.  Genitourinary: Penis normal. Right testis shows swelling. Right testis shows no mass and no tenderness. Left testis shows no mass, no swelling and no tenderness.  Genitourinary Comments: R scrotal sac with swelling and induration chronic.  Musculoskeletal:       Right shoulder: Normal.       Left shoulder: Normal.       Cervical back: Normal.  Lymphadenopathy:    He has no cervical adenopathy.       Right: No inguinal adenopathy present.       Left: No inguinal adenopathy present.  Neurological: He is alert and oriented to person, place, and time. He has normal reflexes. No cranial nerve deficit. He exhibits normal muscle tone. Coordination normal.  Skin: Skin is warm and dry. No rash noted. He is not diaphoretic.  Diffuse sun related skin changes scalp.  R maxillary cheek with 23mm x 7 mm hyperpigmented papule.  Psychiatric: He has a normal mood and  affect. His behavior is normal. Judgment and thought content normal.   No results found. Depression screen [facility] 2/9 02/21/2017 02/13/2017 01/11/2016 07/01/2015 01/06/2015  Decreased Interest 0 0 0 0 0  Down, Depressed, Hopeless 0 0 0 0 0  PHQ - 2 Score 0 0 0 0 0   Fall Risk  02/21/2017 02/13/2017 01/11/2016 01/06/2015  Falls in the past year? No Yes Yes No  Number falls in past yr: - 1 1 -  Injury with Fall? - Yes No -  Comment - concussion  - -  Follow up - Falls prevention discussed - -   Functional Status Survey: Is the patient deaf or have difficulty hearing?: No Does the patient have difficulty seeing, even when wearing glasses/contacts?: No Does the patient have difficulty concentrating, remembering, or making decisions?: No Does the patient have difficulty walking or climbing stairs?: No Does the patient have difficulty dressing or bathing?: No Does the patient have difficulty doing errands alone such as visiting a doctor's office or shopping?: No      Assessment & Plan:   1. Hyperlipidemia, unspecified hyperlipidemia type   2. Hypothyroidism due to acquired atrophy of thyroid   3. RBBB   4. Benign prostatic hyperplasia with nocturia   5. Leg cramps   6. Hyperglycemia   7. Hyperkalemia   8. History of colonic polyps     -controlled hyperlipidemia and hypothyroidism; reviewed labs from last week during visit; refills provided. -suffering with new onset leg cramps associated with excessive sweating and physical activity; has been supplementing with sports drinks and OTC potassium supplementation.  Advised to limit potassium supplement to two daily only; obtain Mg and K level today. -history of RBBB; repeat EKG today; asymptomatic. -recent hyperglycemia; obtain glucose and HgbA1c today. -history of BPH yet asymptomatic today; obtain PSA; no prostate exam obtained due to age and lack of symptoms.  Orders Placed This Encounter  Procedures  . TSH  . PSA  . T4, free  . Basic  metabolic panel  . Magnesium  . Hemoglobin A1c  . POCT urinalysis dipstick  . EKG 12-Lead   Meds ordered this encounter  Medications  . Zoster Vaccine Adjuvanted [facility]) injection    Sig: Inject 0.5 mLs into the muscle once.  Dispense:  0.5 mL    Refill:  1  . atorvastatin (LIPITOR) 20 MG tablet    Sig: TAKE 1 TABLET BY MOUTH  DAILY AT 6 PM    Dispense:  90 tablet    Refill:  3  . levothyroxine (SYNTHROID, LEVOTHROID) 100 MCG tablet    Sig: Take 1 tablet (100 mcg total) by mouth daily.    Dispense:  90 tablet    Refill:  3    Return in about 1 year (around 02/21/2018) for complete physical examiniation.   Jacquilyn Seldon Elayne Guerin, M.D. Primary Care at [facility] previously [facility] [place] [address] [place],   [address] [phone] phone [phone] fax

## 2017-02-21 NOTE — Patient Instructions (Addendum)
   IF you received an x-ray today, you will receive an invoice from [facility]. Please contact [facility] at [phone] with questions or concerns regarding your invoice.   IF you received labwork today, you will receive an invoice from [facility]. Please contact [facility] at [phone] with questions or concerns regarding your invoice.   Our billing staff will not be able to assist you with questions regarding bills from these companies.  You will be contacted with the lab results as soon as they are available. The fastest way to get your results is to activate your My Chart account. Instructions are located on the last page of this paperwork. If you have not heard from us regarding the results in 2 weeks, please contact this office.      Preventive Care 65 Years and Older, Male Preventive care refers to lifestyle choices and visits with your health care provider that can promote health and wellness. What does preventive care include?  A yearly physical exam. This is also called an annual well check.  Dental exams once or twice a year.  Routine eye exams. Ask your health care provider how often you should have your eyes checked.  Personal lifestyle choices, including: ? Daily care of your teeth and gums. ? Regular physical activity. ? Eating a healthy diet. ? Avoiding tobacco and drug use. ? Limiting alcohol use. ? Practicing safe sex. ? Taking low doses of aspirin every day. ? Taking vitamin and mineral supplements as recommended by your health care provider. What happens during an annual well check? The services and screenings done by your health care provider during your annual well check will depend on your age, overall health, lifestyle risk factors, and family history of disease. Counseling Your health care provider may ask you questions about your:  Alcohol use.  Tobacco use.  Drug use.  Emotional well-being.  Home and relationship  well-being.  Sexual activity.  Eating habits.  History of falls.  Memory and ability to understand (cognition).  Work and work environment.  Screening You may have the following tests or measurements:  Height, weight, and BMI.  Blood pressure.  Lipid and cholesterol levels. These may be checked every 5 years, or more frequently if you are over 50 years old.  Skin check.  Lung cancer screening. You may have this screening every year starting at age 55 if you have a 30-pack-year history of smoking and currently smoke or have quit within the past 15 years.  Fecal occult blood test (FOBT) of the stool. You may have this test every year starting at age 50.  Flexible sigmoidoscopy or colonoscopy. You may have a sigmoidoscopy every 5 years or a colonoscopy every 10 years starting at age 50.  Prostate cancer screening. Recommendations will vary depending on your family history and other risks.  Hepatitis C blood test.  Hepatitis B blood test.  Sexually transmitted disease (STD) testing.  Diabetes screening. This is done by checking your blood sugar (glucose) after you have not eaten for a while (fasting). You may have this done every 1-3 years.  Abdominal aortic aneurysm (AAA) screening. You may need this if you are a current or former smoker.  Osteoporosis. You may be screened starting at age 70 if you are at high risk.  Talk with your health care provider about your test results, treatment options, and if necessary, the need for more tests. Vaccines Your health care provider may recommend certain vaccines, such as:  Influenza vaccine. This   is recommended every year.  Tetanus, diphtheria, and acellular pertussis (Tdap, Td) vaccine. You may need a Td booster every 10 years.  Varicella vaccine. You may need this if you have not been vaccinated.  Zoster vaccine. You may need this after age 66.  Measles, mumps, and rubella (MMR) vaccine. You may need at least one dose of  MMR if you were born in 1957 or later. You may also need a second dose.  Pneumococcal 13-valent conjugate (PCV13) vaccine. One dose is recommended after age 60.  Pneumococcal polysaccharide (PPSV23) vaccine. One dose is recommended after age 68.  Meningococcal vaccine. You may need this if you have certain conditions.  Hepatitis A vaccine. You may need this if you have certain conditions or if you travel or work in places where you may be exposed to hepatitis A.  Hepatitis B vaccine. You may need this if you have certain conditions or if you travel or work in places where you may be exposed to hepatitis B.  Haemophilus influenzae type b (Hib) vaccine. You may need this if you have certain risk factors.  Talk to your health care provider about which screenings and vaccines you need and how often you need them. This information is not intended to replace advice given to you by your health care provider. Make sure you discuss any questions you have with your health care provider. Document Released: 05/21/2015 Document Revised: 01/12/2016 Document Reviewed: 02/23/2015 Elsevier Interactive Patient Education  2017 [employer].

## 2017-02-22 LAB — MAGNESIUM: Magnesium: 2.1 mg/dL (ref 1.6–2.3)

## 2017-02-22 LAB — BASIC METABOLIC PANEL
BUN/Creatinine Ratio: 13 (ref 10–24)
BUN: 14 mg/dL (ref 8–27)
CALCIUM: 9.3 mg/dL (ref 8.6–10.2)
CO2: 26 mmol/L (ref 20–29)
CREATININE: 1.04 mg/dL (ref 0.76–1.27)
Chloride: 106 mmol/L (ref 96–106)
GFR, EST AFRICAN AMERICAN: 85 mL/min/{1.73_m2} (ref >59)
GFR, EST NON AFRICAN AMERICAN: 74 mL/min/{1.73_m2} (ref >59)
Glucose: 89 mg/dL (ref 65–99)
Potassium: 4.5 mmol/L (ref 3.5–5.2)
Sodium: 143 mmol/L (ref 134–144)

## 2017-02-22 LAB — HEMOGLOBIN A1C
ESTIMATED AVERAGE GLUCOSE: 108 mg/dL
HEMOGLOBIN A1C: 5.4 % (ref 4.8–5.6)

## 2017-02-22 LAB — TSH: TSH: 2.71 u[IU]/mL (ref 0.450–4.500)

## 2017-02-22 LAB — PSA: Prostate Specific Ag, Serum: 1.3 ng/mL (ref 0.0–4.0)

## 2017-02-22 LAB — T4, FREE: Free T4: 1.24 ng/dL (ref 0.82–1.77)

## 2017-02-27 ENCOUNTER — Other Ambulatory Visit: Payer: Self-pay | Admitting: Family Medicine

## 2017-03-12 ENCOUNTER — Other Ambulatory Visit: Payer: Self-pay | Admitting: Family Medicine

## 2017-03-12 ENCOUNTER — Telehealth: Payer: Self-pay | Admitting: Family Medicine

## 2017-03-12 NOTE — Telephone Encounter (Signed)
Pt called because he said his pharmacy said we never sent his prescriptions.  We did but we sent them to the wrong pharmacy.  He told us it needed to go to the [employer] [facility] at his visit.  We sent to [facility], his old one.  He is out of the levothyroxine completely.  [facility] says it will take 10 days once we've sent script for him to receive it.  Is it ok if he is off of it this long?  And if not can we send 10-day supply to local pharmacy? We also need to send in lipitor to [facility]. [phone]

## 2017-03-13 ENCOUNTER — Other Ambulatory Visit: Payer: Self-pay

## 2017-03-13 MED ORDER — ATORVASTATIN CALCIUM 20 MG PO TABS: ORAL_TABLET | ORAL | 0 refills | Status: DC

## 2017-03-13 MED ORDER — LEVOTHYROXINE SODIUM 100 MCG PO TABS: 100.0000 ug | ORAL_TABLET | Freq: Every day | ORAL | 0 refills | Status: DC

## 2017-03-13 NOTE — Telephone Encounter (Signed)
Have sent in Rx to mail order and have set 10 day supply to local pharmacy. Pt has been informed of refills being sent.

## 2017-03-13 NOTE — Telephone Encounter (Signed)
Patient meds was sent to the correct pharmacy but not the 10 day supply pt is asking for to hold him until his meds come thru mail order.  Local pharmacy pt use is:  [place], [place] - [place] [phone] (Phone) [phone] (Fax)

## 2017-03-13 NOTE — Telephone Encounter (Signed)
OK to send ten day rx to local pharmacy.

## 2017-03-13 NOTE — Telephone Encounter (Signed)
Please advise 

## 2017-03-13 NOTE — Telephone Encounter (Signed)
Rx sent today [facility].

## 2017-05-10 ENCOUNTER — Telehealth: Payer: Self-pay | Admitting: Family Medicine

## 2017-05-10 NOTE — Telephone Encounter (Signed)
Copied from [place] [id number]. Topic: Quick Communication - Rx Refill/Question >> May 10, 2017 11:50 AM Malena Catholic I, NT wrote: Has the patient contacted their pharmacy yes    (Agent: If no, request that the patient contact the pharmacy for the refill Lipitor 20 Mg Synthroid 100 Mg     Preferred Pharmacy (with phone number or street name [place] [phone] Fax [phone]    Agent: Please be advised that RX refills may take up to 3 business days. We ask that you follow-up with your pharmacy.

## 2017-05-14 ENCOUNTER — Telehealth: Payer: Self-pay | Admitting: *Deleted

## 2017-05-14 NOTE — Telephone Encounter (Signed)
Patient call with the correct number to [facility] (new) to send his Rxs for Lipitor 20 mg and Synthroid 100 mcg. Send 90 days with 3 refills for each medication.  He stated he has enough to last until the end of this month, he will call the pharmacy a week before he runs out to check. The pharmacy number is: [phone] and fax number is [phone]. Thank you.

## 2017-05-14 NOTE — Telephone Encounter (Signed)
Called left message in mobile voice mail checking if he received medication from mail order pharmacy [facility]. It can take at least 3 business days to receive medications.

## 2017-05-23 NOTE — Telephone Encounter (Signed)
Pt called again about his refill, he states that the pharmacy does  not have the refills and he has called several times. He needs lipitor and synthroid [facility]  cb for pt is # [phone]

## 2017-05-24 ENCOUNTER — Other Ambulatory Visit: Payer: Self-pay

## 2017-05-24 MED ORDER — LEVOTHYROXINE SODIUM 100 MCG PO TABS: 100.0000 ug | ORAL_TABLET | Freq: Every day | ORAL | 2 refills | Status: DC

## 2017-05-24 MED ORDER — ATORVASTATIN CALCIUM 20 MG PO TABS: ORAL_TABLET | ORAL | 2 refills | Status: DC

## 2017-05-24 NOTE — Telephone Encounter (Signed)
Refill sent.

## 2017-06-07 DIAGNOSIS — D225 Melanocytic nevi of trunk: Secondary | ICD-10-CM | POA: Diagnosis not present

## 2017-06-07 DIAGNOSIS — D0339 Melanoma in situ of other parts of face: Secondary | ICD-10-CM | POA: Diagnosis not present

## 2017-06-07 DIAGNOSIS — L570 Actinic keratosis: Secondary | ICD-10-CM | POA: Diagnosis not present

## 2017-06-07 DIAGNOSIS — L814 Other melanin hyperpigmentation: Secondary | ICD-10-CM | POA: Diagnosis not present

## 2017-06-07 DIAGNOSIS — L821 Other seborrheic keratosis: Secondary | ICD-10-CM | POA: Diagnosis not present

## 2017-06-07 DIAGNOSIS — C4441 Basal cell carcinoma of skin of scalp and neck: Secondary | ICD-10-CM | POA: Diagnosis not present

## 2017-06-07 DIAGNOSIS — D2261 Melanocytic nevi of right upper limb, including shoulder: Secondary | ICD-10-CM | POA: Diagnosis not present

## 2017-06-07 DIAGNOSIS — D2262 Melanocytic nevi of left upper limb, including shoulder: Secondary | ICD-10-CM | POA: Diagnosis not present

## 2017-07-03 DIAGNOSIS — D0339 Melanoma in situ of other parts of face: Secondary | ICD-10-CM | POA: Diagnosis not present

## 2017-07-10 DIAGNOSIS — C44519 Basal cell carcinoma of skin of other part of trunk: Secondary | ICD-10-CM | POA: Diagnosis not present

## 2017-10-02 ENCOUNTER — Encounter: Payer: Self-pay | Admitting: Family Medicine

## 2017-10-04 ENCOUNTER — Telehealth: Payer: Self-pay | Admitting: Family Medicine

## 2017-10-04 NOTE — Telephone Encounter (Signed)
I left a voicemail in regards to cancelling/rescheduling Joshua Moreno appt with Dr. Tamala Julian on 02/25/2018 due to provider leaving.

## 2018-01-15 DIAGNOSIS — L570 Actinic keratosis: Secondary | ICD-10-CM | POA: Diagnosis not present

## 2018-01-15 DIAGNOSIS — D485 Neoplasm of uncertain behavior of skin: Secondary | ICD-10-CM | POA: Diagnosis not present

## 2018-01-15 DIAGNOSIS — B078 Other viral warts: Secondary | ICD-10-CM | POA: Diagnosis not present

## 2018-01-15 DIAGNOSIS — L814 Other melanin hyperpigmentation: Secondary | ICD-10-CM | POA: Diagnosis not present

## 2018-01-15 DIAGNOSIS — D225 Melanocytic nevi of trunk: Secondary | ICD-10-CM | POA: Diagnosis not present

## 2018-01-15 DIAGNOSIS — Z85820 Personal history of malignant melanoma of skin: Secondary | ICD-10-CM | POA: Diagnosis not present

## 2018-01-15 DIAGNOSIS — Z85828 Personal history of other malignant neoplasm of skin: Secondary | ICD-10-CM | POA: Diagnosis not present

## 2018-01-15 DIAGNOSIS — D045 Carcinoma in situ of skin of trunk: Secondary | ICD-10-CM | POA: Diagnosis not present

## 2018-01-15 DIAGNOSIS — L821 Other seborrheic keratosis: Secondary | ICD-10-CM | POA: Diagnosis not present

## 2018-02-25 ENCOUNTER — Ambulatory Visit: Payer: Medicare Other | Admitting: Family Medicine

## 2018-03-12 NOTE — Progress Notes (Addendum)
Subjective:    Patient ID: Joshua Moreno, male    DOB: 07-27-1950, 67 y.o.   MRN: [id number]  HPI:  Mr.. Moreno is here to establish as a new pt.  He is a pleasant 67 year old male. PMH: HLD, Hypothyrodism, RBBB He has family hx of CAD, previous PCP sent him to cards for work-up, discovered RBBB, all other work-up was negative He has been on Levothyroxine 168mcg >10 years, last TSH 02/2017 2.710 He estimates to drink 20-30 oz plain water/day, 1-2 bottles "sports drinks" He plays tennis 4 times week for 2 hrs per session He denies current tobacco/vape use He estimates to drink 10 cocktails/week He is married and quite active with his community and local church His wife has been battling melanoma >8years    Patient Care Team    Relationship Specialty Notifications Start End  [place], [name] D, NP PCP - General Family Medicine  03/13/18   [place], Heather, [place]  Optometry  02/13/17     Patient Active Problem List   Diagnosis Date Noted  . Hyperlipidemia 12/04/2011  . Hypothyroidism 12/04/2011  . RBBB 12/04/2011  . Overweight (BMI 25.0-29.9) 12/04/2011  . History of tobacco use 12/04/2011  . History of colonic polyps 06/26/2008     Past Medical History:  Diagnosis Date  . Hyperlipidemia   . Personal history of colonic polyps-adenoma 06/26/2008  . Skin cancer of face    Actinic keratoses; followed annually by [facility]  . Thyroid disease      Past Surgical History:  Procedure Laterality Date  . HYDROCELE EXCISION / REPAIR     R scrotum  . tail bone       Family History  Problem Relation Age of Onset  . Heart disease Father 29       CABG/CAD  . Alzheimer's disease Mother   . Colon cancer Neg Hx   . Esophageal cancer Neg Hx   . Rectal cancer Neg Hx   . Stomach cancer Neg Hx      Social History   Substance and Sexual Activity  Drug Use No     Social History   Substance and Sexual Activity  Alcohol Use Yes  . Alcohol/week: 16.0 standard drinks  .  Types: 6 Standard drinks or equivalent, 4 Cans of beer, 6 Shots of liquor per week     Social History   Tobacco Use  Smoking Status Former Smoker  . Packs/day: 1.00  . Years: 35.00  . Pack years: 35.00  . Types: Cigarettes  . Last attempt to quit: 12/07/2003  . Years since quitting: 14.2  Smokeless Tobacco Never Used     Outpatient Encounter Medications as of 03/13/2018  Medication Sig  . Ascorbic Acid (VITAMIN C) 100 MG tablet Take 200 mg by mouth daily.  Marland Kitchen aspirin EC 81 MG tablet Take 81 mg by mouth every other day.  Marland Kitchen atorvastatin (LIPITOR) 10 MG tablet Take 1 tablet (10 mg total) by mouth daily.  Marland Kitchen levothyroxine (SYNTHROID, LEVOTHROID) 100 MCG tablet Take 1 tablet (100 mcg total) by mouth daily.  Marland Kitchen pyridOXINE (VITAMIN B-6) 100 MG tablet Take 100 mg by mouth daily.  . [DISCONTINUED] atorvastatin (LIPITOR) 10 MG tablet Take 10 mg by mouth daily.  . [DISCONTINUED] levothyroxine (SYNTHROID, LEVOTHROID) 100 MCG tablet Take 1 tablet (100 mcg total) by mouth daily.  . [DISCONTINUED] LITHIUM PO Take 5 mg by mouth daily.   . [DISCONTINUED] aspirin 81 MG tablet Take 81 mg by mouth daily. Do  not take again until Jun 18, 2014  . [DISCONTINUED] atorvastatin (LIPITOR) 20 MG tablet TAKE 1 TABLET EVERY DAY  AT  6PM   No facility-administered encounter medications on file as of 03/13/2018.     Allergies: Cinnamon  Body mass index is 29.13 kg/m.  Blood pressure 138/77, pulse (!) 55, temperature 97.9 F (36.6 C), temperature source Oral, height 5' 8.5" (1.74 m), weight 194 lb 6.4 oz (88.2 kg), SpO2 97 %.    Review of Systems  Constitutional: Positive for fatigue. Negative for activity change, appetite change, chills, diaphoresis, fever and unexpected weight change.  HENT: Negative for congestion.   Eyes: Negative for visual disturbance.  Respiratory: Negative for cough, chest tightness, shortness of breath, wheezing and stridor.   Cardiovascular: Negative for chest pain, palpitations  and leg swelling.  Gastrointestinal: Negative for abdominal distention, abdominal pain, blood in stool, constipation, diarrhea, nausea and vomiting.  Endocrine: Negative for cold intolerance, heat intolerance, polydipsia, polyphagia and polyuria.  Genitourinary: Negative for difficulty urinating and flank pain.  Musculoskeletal: Negative for arthralgias, back pain, gait problem, joint swelling, myalgias, neck pain and neck stiffness.  Skin: Negative for color change, pallor, rash and wound.  Neurological: Negative for dizziness and headaches.  Hematological: Does not bruise/bleed easily.  Psychiatric/Behavioral: Negative for behavioral problems, confusion, decreased concentration, dysphoric mood, hallucinations, self-injury, sleep disturbance and suicidal ideas. The patient is not nervous/anxious and is not hyperactive.        Objective:   Physical Exam  Constitutional: He is oriented to person, place, and time. He appears well-developed and well-nourished. No distress.  HENT:  Head: Normocephalic and atraumatic.  Right Ear: External ear normal.  Left Ear: External ear normal.  Nose: Nose normal.  Mouth/Throat: Oropharynx is clear and moist.  Cardiovascular: Normal rate, regular rhythm and intact distal pulses.  Murmur heard. Extremely faint murmur heard  Pulmonary/Chest: Effort normal and breath sounds normal. No stridor. No respiratory distress. He has no wheezes. He has no rales. He exhibits no tenderness.  Neurological: He is alert and oriented to person, place, and time.  Skin: Skin is warm and dry. Capillary refill takes less than 2 seconds. No rash noted. He is not diaphoretic. No erythema. No pallor.  Psychiatric: He has a normal mood and affect. His behavior is normal. Judgment and thought content normal.  Nursing note and vitals reviewed.     Assessment & Plan:   1. Hypothyroidism, unspecified type   2. Hyperlipidemia, unspecified hyperlipidemia type   3. Healthcare  maintenance   4. RBBB     Healthcare maintenance Please continue all medications as directed. Increase water intake, strive for at least 80 oz/day. Follow Mediterranean diet and continue regular exercise. Please schedule complete physical in 4 weeks, please schedule fasting lab appt the week prior.  Hypothyroidism Levothyroxine 116mcg refilled  RBBB Dx'd 2004 Denies acute cardiac sx's   Hyperlipidemia Currently on Atorvastatin 10mg   Follow Mediterranean Diet and continue regular exercise     FOLLOW-UP:  Return in about 4 weeks (around 04/10/2018) for CPE, Fasting Labs.

## 2018-03-13 ENCOUNTER — Ambulatory Visit (INDEPENDENT_AMBULATORY_CARE_PROVIDER_SITE_OTHER): Payer: Medicare Other | Admitting: Adult Health

## 2018-03-13 ENCOUNTER — Encounter: Payer: Self-pay | Admitting: Adult Health

## 2018-03-13 VITALS — BP 138/77 | HR 55 | Temp 97.9°F | Ht 68.5 in | Wt 194.4 lb

## 2018-03-13 DIAGNOSIS — E039 Hypothyroidism, unspecified: Secondary | ICD-10-CM | POA: Diagnosis not present

## 2018-03-13 DIAGNOSIS — I4510 Unspecified right bundle-branch block: Secondary | ICD-10-CM

## 2018-03-13 DIAGNOSIS — E785 Hyperlipidemia, unspecified: Secondary | ICD-10-CM

## 2018-03-13 DIAGNOSIS — Z0000 Encounter for general adult medical examination without abnormal findings: Secondary | ICD-10-CM | POA: Diagnosis not present

## 2018-03-13 MED ORDER — LEVOTHYROXINE SODIUM 100 MCG PO TABS: 100.0000 ug | ORAL_TABLET | Freq: Every day | ORAL | 0 refills | Status: DC

## 2018-03-13 MED ORDER — ATORVASTATIN CALCIUM 10 MG PO TABS: 10.0000 mg | ORAL_TABLET | Freq: Every day | ORAL | 0 refills | Status: DC

## 2018-03-13 NOTE — Assessment & Plan Note (Signed)
Currently on Atorvastatin 10mg   Follow Mediterranean Diet and continue regular exercise

## 2018-03-13 NOTE — Assessment & Plan Note (Signed)
Dx'd 2004 Denies acute cardiac sx's

## 2018-03-13 NOTE — Assessment & Plan Note (Signed)
Please continue all medications as directed. Increase water intake, strive for at least 80 oz/day. Follow Mediterranean diet and continue regular exercise. Please schedule complete physical in 4 weeks, please schedule fasting lab appt the week prior.

## 2018-03-13 NOTE — Patient Instructions (Addendum)
Mediterranean Diet A Mediterranean diet refers to food and lifestyle choices that are based on the traditions of countries located on the [place] Sea. This way of eating has been shown to help prevent certain conditions and improve outcomes for people who have chronic diseases, like kidney disease and heart disease. What are tips for following this plan? Lifestyle  Cook and eat meals together with your family, when possible.  Drink enough fluid to keep your urine clear or pale yellow.  Be physically active every day. This includes: ? Aerobic exercise like running or swimming. ? Leisure activities like gardening, walking, or housework.  Get 7-8 hours of sleep each night.  If recommended by your health care provider, drink red wine in moderation. This means 1 glass a day for nonpregnant women and 2 glasses a day for men. A glass of wine equals 5 oz (150 mL). Reading food labels  Check the serving size of packaged foods. For foods such as rice and pasta, the serving size refers to the amount of cooked product, not dry.  Check the total fat in packaged foods. Avoid foods that have saturated fat or trans fats.  Check the ingredients list for added sugars, such as corn syrup. Shopping  At the grocery store, buy most of your food from the areas near the walls of the store. This includes: ? Fresh fruits and vegetables (produce). ? Grains, beans, nuts, and seeds. Some of these may be available in unpackaged forms or large amounts (in bulk). ? Fresh seafood. ? Poultry and eggs. ? Low-fat dairy products.  Buy whole ingredients instead of prepackaged foods.  Buy fresh fruits and vegetables in-season from local farmers markets.  Buy frozen fruits and vegetables in resealable bags.  If you do not have access to quality fresh seafood, buy precooked frozen shrimp or canned fish, such as tuna, salmon, or sardines.  Buy small amounts of raw or cooked vegetables, salads, or olives from the  deli or salad bar at your store.  Stock your pantry so you always have certain foods on hand, such as olive oil, canned tuna, canned tomatoes, rice, pasta, and beans. Cooking  Cook foods with extra-virgin olive oil instead of using butter or other vegetable oils.  Have meat as a side dish, and have vegetables or grains as your main dish. This means having meat in small portions or adding small amounts of meat to foods like pasta or stew.  Use beans or vegetables instead of meat in common dishes like chili or lasagna.  Experiment with different cooking methods. Try roasting or broiling vegetables instead of steaming or sauteing them.  Add frozen vegetables to soups, stews, pasta, or rice.  Add nuts or seeds for added healthy fat at each meal. You can add these to yogurt, salads, or vegetable dishes.  Marinate fish or vegetables using olive oil, lemon juice, garlic, and fresh herbs. Meal planning  Plan to eat 1 vegetarian meal one day each week. Try to work up to 2 vegetarian meals, if possible.  Eat seafood 2 or more times a week.  Have healthy snacks readily available, such as: ? Vegetable sticks with hummus. ? Greek yogurt. ? Fruit and nut trail mix.  Eat balanced meals throughout the week. This includes: ? Fruit: 2-3 servings a day ? Vegetables: 4-5 servings a day ? Low-fat dairy: 2 servings a day ? Fish, poultry, or lean meat: 1 serving a day ? Beans and legumes: 2 or more servings a week ? Nuts   and seeds: 1-2 servings a day ? Whole grains: 6-8 servings a day ? Extra-virgin olive oil: 3-4 servings a day  Limit red meat and sweets to only a few servings a month What are my food choices?  Mediterranean diet ? Recommended ? Grains: Whole-grain pasta. Brown rice. Bulgar wheat. Polenta. Couscous. Whole-wheat bread. [place]. ? Vegetables: Artichokes. Beets. Broccoli. Cabbage. Carrots. Eggplant. Green beans. Chard. Kale. Spinach. Onions. Leeks. Peas. Squash.  Tomatoes. Peppers. Radishes. ? Fruits: Apples. Apricots. Avocado. Berries. Bananas. Cherries. Dates. Figs. Grapes. Lemons. Melon. Oranges. Peaches. Plums. Pomegranate. ? Meats and other protein foods: Beans. Almonds. Sunflower seeds. Pine nuts. Peanuts. [place]. Salmon. Scallops. Shrimp. [place]. Tilapia. Clams. Oysters. Eggs. ? Dairy: Low-fat milk. Cheese. Greek yogurt. ? Beverages: Water. Red wine. Herbal tea. ? Fats and oils: Extra virgin olive oil. Avocado oil. Grape seed oil. ? Sweets and desserts: [place] yogurt with honey. Baked apples. Poached pears. Trail mix. ? Seasoning and other foods: Basil. Cilantro. Coriander. Cumin. Mint. Parsley. Sage. Rosemary. Tarragon. Garlic. Oregano. Thyme. Pepper. Balsalmic vinegar. Tahini. Hummus. Tomato sauce. Olives. Mushrooms. ? Limit these ? Grains: Prepackaged pasta or rice dishes. Prepackaged cereal with added sugar. ? Vegetables: Deep fried potatoes (french fries). ? Fruits: Fruit canned in syrup. ? Meats and other protein foods: Beef. Pork. Lamb. Poultry with skin. Hot dogs. [place] Salines. ? Dairy: Ice cream. Sour cream. Whole milk. ? Beverages: Juice. Sugar-sweetened soft drinks. Beer. Liquor and spirits. ? Fats and oils: Butter. Canola oil. Vegetable oil. Beef fat (tallow). Lard. ? Sweets and desserts: Cookies. Cakes. Pies. Candy. ? Seasoning and other foods: Mayonnaise. Premade sauces and marinades. ? The items listed may not be a complete list. Talk with your dietitian about what dietary choices are right for you. Summary  The Mediterranean diet includes both food and lifestyle choices.  Eat a variety of fresh fruits and vegetables, beans, nuts, seeds, and whole grains.  Limit the amount of red meat and sweets that you eat.  Talk with your health care provider about whether it is safe for you to drink red wine in moderation. This means 1 glass a day for nonpregnant women and 2 glasses a day for men. A glass of wine equals 5 oz (150 mL). This information  is not intended to replace advice given to you by your health care provider. Make sure you discuss any questions you have with your health care provider. Document Released: 12/16/2015 Document Revised: 01/18/2016 Document Reviewed: 12/16/2015 Elsevier Interactive Patient Education  [employer].  Please continue all medications as directed. Increase water intake, strive for at least 80 oz/day. Follow Mediterranean diet and continue regular exercise. Please schedule complete physical in 4 weeks, please schedule fasting lab appt the week prior. WELCOME TO THE PRACTICE!

## 2018-03-13 NOTE — Assessment & Plan Note (Signed)
Levothyroxine 163mcg refilled

## 2018-03-18 ENCOUNTER — Telehealth: Payer: Self-pay | Admitting: Adult Health

## 2018-03-18 NOTE — Telephone Encounter (Signed)
Joshua Moreno from [facility] order pharmcy @ [phone] request call back to discuss Rx dosage reduction-- Per pharmacy tech all pt's prior Rx were 20 MG's --- new Rx shows 10MG  and she just wanted to make sure that was correct.  --Forwarding message to medical assistant to contact her at above listed ph#.  --glh

## 2018-03-19 ENCOUNTER — Telehealth: Payer: Self-pay | Admitting: Adult Health

## 2018-03-19 NOTE — Telephone Encounter (Signed)
2nd Pharmacy rep/ Ellin Saba cld from [phone] to clarify dosage on :  atorvastatin (LIPITOR) 10 MG tablet [[id number]]   Order Details  Dose: 10 mg Route: Oral Frequency: Daily  Dispense Quantity: 90 tablet Refills: 0 Fills remaining: --        Sig: Take 1 tablet (10 mg total) by mouth daily.     --- all prior Rx were 20 MG & this new on is only 10 MG.  --forwarding message to medical assistant. -glh

## 2018-03-20 NOTE — Telephone Encounter (Signed)
See additional phone message for documentation.  Charyl Bigger, CMA

## 2018-03-20 NOTE — Telephone Encounter (Signed)
Spoke with pt and verified dosage of atorvastatin.  Pt states that for approximately the last 8 years, he has been taking 1/2 tablet of 20mg , making a 10mg  dose, once daily.  Charyl Bigger, CMA

## 2018-03-20 NOTE — Telephone Encounter (Signed)
LVM for pt to call to discuss.  T. Khrystian Schauf, CMA  

## 2018-03-20 NOTE — Telephone Encounter (Signed)
Returned call to [facility] and advised her that 10mg  tablets are the correct dosage of atorvastatin for the patient.  Charyl Bigger, CMA

## 2018-04-09 ENCOUNTER — Other Ambulatory Visit: Payer: Medicare Other

## 2018-04-09 DIAGNOSIS — Z0000 Encounter for general adult medical examination without abnormal findings: Secondary | ICD-10-CM | POA: Diagnosis not present

## 2018-04-09 DIAGNOSIS — E785 Hyperlipidemia, unspecified: Secondary | ICD-10-CM | POA: Diagnosis not present

## 2018-04-09 DIAGNOSIS — R739 Hyperglycemia, unspecified: Secondary | ICD-10-CM | POA: Diagnosis not present

## 2018-04-09 DIAGNOSIS — E039 Hypothyroidism, unspecified: Secondary | ICD-10-CM

## 2018-04-10 LAB — LIPID PANEL
Chol/HDL Ratio: 2.8 ratio (ref 0.0–5.0)
Cholesterol, Total: 187 mg/dL (ref 100–199)
HDL: 67 mg/dL (ref >39)
LDL Calculated: 103 mg/dL — ABNORMAL HIGH (ref 0–99)
Triglycerides: 87 mg/dL (ref 0–149)
VLDL Cholesterol Cal: 17 mg/dL (ref 5–40)

## 2018-04-10 LAB — CBC WITH DIFFERENTIAL/PLATELET
Basophils Absolute: 0.1 10*3/uL (ref 0.0–0.2)
Basos: 1 %
EOS (ABSOLUTE): 0.2 10*3/uL (ref 0.0–0.4)
Eos: 2 %
Hematocrit: 43.9 % (ref 37.5–51.0)
Hemoglobin: 15.5 g/dL (ref 13.0–17.7)
IMMATURE GRANULOCYTES: 0 %
Immature Grans (Abs): 0 10*3/uL (ref 0.0–0.1)
LYMPHS: 21 %
Lymphocytes Absolute: 1.6 10*3/uL (ref 0.7–3.1)
MCH: 32.8 pg (ref 26.6–33.0)
MCHC: 35.3 g/dL (ref 31.5–35.7)
MCV: 93 fL (ref 79–97)
MONOCYTES: 8 %
Monocytes Absolute: 0.6 10*3/uL (ref 0.1–0.9)
Neutrophils Absolute: 5.1 10*3/uL (ref 1.4–7.0)
Neutrophils: 68 %
Platelets: 267 10*3/uL (ref 150–450)
RBC: 4.72 x10E6/uL (ref 4.14–5.80)
RDW: 11.8 % — ABNORMAL LOW (ref 12.3–15.4)
WBC: 7.5 10*3/uL (ref 3.4–10.8)

## 2018-04-10 LAB — COMPREHENSIVE METABOLIC PANEL
ALT: 36 IU/L (ref 0–44)
AST: 29 IU/L (ref 0–40)
Albumin/Globulin Ratio: 1.8 (ref 1.2–2.2)
Albumin: 3.9 g/dL (ref 3.6–4.8)
Alkaline Phosphatase: 77 IU/L (ref 39–117)
BUN/Creatinine Ratio: 14 (ref 10–24)
BUN: 17 mg/dL (ref 8–27)
Bilirubin Total: 0.8 mg/dL (ref 0.0–1.2)
CO2: 23 mmol/L (ref 20–29)
Calcium: 9.1 mg/dL (ref 8.6–10.2)
Chloride: 109 mmol/L — ABNORMAL HIGH (ref 96–106)
Creatinine, Ser: 1.2 mg/dL (ref 0.76–1.27)
GFR calc Af Amer: 71 mL/min/{1.73_m2} (ref >59)
GFR calc non Af Amer: 62 mL/min/{1.73_m2} (ref >59)
Globulin, Total: 2.2 g/dL (ref 1.5–4.5)
Glucose: 101 mg/dL — ABNORMAL HIGH (ref 65–99)
Potassium: 4.6 mmol/L (ref 3.5–5.2)
Sodium: 146 mmol/L — ABNORMAL HIGH (ref 134–144)
Total Protein: 6.1 g/dL (ref 6.0–8.5)

## 2018-04-10 LAB — HEMOGLOBIN A1C
Est. average glucose Bld gHb Est-mCnc: 111 mg/dL
Hgb A1c MFr Bld: 5.5 % (ref 4.8–5.6)

## 2018-04-10 LAB — TSH: TSH: 4.37 u[IU]/mL (ref 0.450–4.500)

## 2018-04-16 NOTE — Progress Notes (Signed)
Subjective:    Patient ID: Joshua Moreno, male    DOB: 01-05-1951, 67 y.o.   MRN: [id number]  HPI:111/6/19 OV:   Joshua Moreno is here to establish as a new pt.  He is a pleasant 67 year old male. PMH: HLD, Hypothyrodism, RBBB He has family hx of CAD, previous PCP sent him to cards for work-up, discovered RBBB, all other work-up was negative He has been on Levothyroxine 146mcg >10 years, last TSH 02/2017 2.710 He estimates to drink 20-30 oz plain water/day, 1-2 bottles "sports drinks" He plays tennis 4 times week for 2 hrs per session He denies current tobacco/vape use He estimates to drink 10 cocktails/week He is married and quite active with his community and local church His wife has been battling melanoma >8years   04/17/18 OV: Joshua Moreno is here for CPE He reports medication compliance, denies SE He continues to play tennis and abstain from tobacco/vape use  Reviewed recent labs- The 10-year ASCVD risk score Mikey Bussing DC Brooke Bonito., et al., 2013) is: 12.5%   Values used to calculate the score:     Age: 11 years     Sex: Male     Is Non-Hispanic African American: No     Diabetic: No     Tobacco smoker: No     Systolic Blood Pressure: 782 mmHg     Is BP treated: No     HDL Cholesterol: 67 mg/dL     Total Cholesterol: 187 mg/dL  LDL-103 Currently on Atorvastatin 10mg  QD, declined increasing at this time- prefers to improve diet and re-check in 4 months Increase in TSH 4.370, last year it was 2.710  He has been on Levothyroxine 135mcg >10 years  Healthcare Maintenance: Colonoscopy-UTD, last 06/04/14 LDCT-ordered AAA Screening-ordered Immunizations-Influenza vaccination given today  Patient Care Team    Relationship Specialty Notifications Start End  [place], [name] D, NP PCP - General Family Medicine  03/13/18   [place], Heather, [place]  Optometry  02/13/17   Harriett Sine, MD Consulting Physician Dermatology  03/13/18     Patient Active Problem List   Diagnosis Date Noted  . Murmur,  cardiac 04/17/2018  . Healthcare maintenance 03/13/2018  . Hyperlipidemia 12/04/2011  . Hypothyroidism 12/04/2011  . RBBB 12/04/2011  . Overweight (BMI 25.0-29.9) 12/04/2011  . History of tobacco use 12/04/2011  . History of colonic polyps 06/26/2008     Past Medical History:  Diagnosis Date  . Hyperlipidemia   . Personal history of colonic polyps-adenoma 06/26/2008  . Skin cancer of face    Actinic keratoses; followed annually by [facility]  . Thyroid disease      Past Surgical History:  Procedure Laterality Date  . HYDROCELE EXCISION / REPAIR     R scrotum  . tail bone       Family History  Problem Relation Age of Onset  . Heart disease Father 10       CABG/CAD  . Alzheimer's disease Mother   . Colon cancer Neg Hx   . Esophageal cancer Neg Hx   . Rectal cancer Neg Hx   . Stomach cancer Neg Hx      Social History   Substance and Sexual Activity  Drug Use No     Social History   Substance and Sexual Activity  Alcohol Use Yes  . Alcohol/week: 16.0 standard drinks  . Types: 6 Standard drinks or equivalent, 4 Cans of beer, 6 Shots of liquor per week     Social History  Tobacco Use  Smoking Status Former Smoker  . Packs/day: 1.00  . Years: 35.00  . Pack years: 35.00  . Types: Cigarettes  . Last attempt to quit: 12/07/2003  . Years since quitting: 14.3  Smokeless Tobacco Never Used     Outpatient Encounter Medications as of 04/17/2018  Medication Sig  . Ascorbic Acid (VITAMIN C) 100 MG tablet Take 200 mg by mouth daily.  Marland Kitchen aspirin EC 81 MG tablet Take 81 mg by mouth every other day.  Marland Kitchen atorvastatin (LIPITOR) 10 MG tablet Take 1 tablet (10 mg total) by mouth daily.  Marland Kitchen levothyroxine (SYNTHROID, LEVOTHROID) 100 MCG tablet Take 1 tablet (100 mcg total) by mouth daily.  Marland Kitchen pyridOXINE (VITAMIN B-6) 100 MG tablet Take 100 mg by mouth daily.  Marland Kitchen Zoster Vaccine Adjuvanted [facility]) injection Inject 0.5 mLs into the muscle once for 1 dose.    No facility-administered encounter medications on file as of 04/17/2018.     Allergies: Cinnamon  Body mass index is 28.96 kg/m.  Blood pressure 124/75, pulse (!) 54, temperature 98.2 F (36.8 C), temperature source Oral, height 5' 8.5" (1.74 m), weight 193 lb 4.8 oz (87.7 kg), SpO2 98 %.  Review of Systems  Constitutional: Positive for fatigue. Negative for activity change, appetite change, chills, diaphoresis, fever and unexpected weight change.  HENT: Negative for congestion.   Eyes: Negative for visual disturbance.  Respiratory: Negative for cough, chest tightness, shortness of breath, wheezing and stridor.   Cardiovascular: Negative for chest pain, palpitations and leg swelling.  Gastrointestinal: Negative for abdominal distention, abdominal pain, blood in stool, constipation, diarrhea, nausea and vomiting.  Endocrine: Negative for cold intolerance, heat intolerance, polydipsia, polyphagia and polyuria.  Genitourinary: Negative for difficulty urinating and flank pain.  Musculoskeletal: Negative for arthralgias, back pain, gait problem, joint swelling, myalgias, neck pain and neck stiffness.  Skin: Negative for color change, pallor, rash and wound.  Neurological: Negative for dizziness and headaches.  Hematological: Does not bruise/bleed easily.  Psychiatric/Behavioral: Negative for behavioral problems, confusion, decreased concentration, dysphoric mood, hallucinations, self-injury, sleep disturbance and suicidal ideas. The patient is not nervous/anxious and is not hyperactive.        Objective:   Physical Exam  Constitutional: He is oriented to person, place, and time. He appears well-developed and well-nourished. No distress.  HENT:  Head: Normocephalic and atraumatic.  Right Ear: External ear normal. Tympanic membrane is not erythematous and not bulging. No decreased hearing is noted.  Left Ear: External ear normal. Tympanic membrane is not erythematous and not bulging.  No decreased hearing is noted.  Nose: Nose normal. No mucosal edema or rhinorrhea. Right sinus exhibits no maxillary sinus tenderness and no frontal sinus tenderness. Left sinus exhibits no maxillary sinus tenderness and no frontal sinus tenderness.  Mouth/Throat: Oropharynx is clear and moist and mucous membranes are normal. Normal dentition. No posterior oropharyngeal edema or posterior oropharyngeal erythema. Tonsils are 0 on the right. Tonsils are 0 on the left.  Eyes: Pupils are equal, round, and reactive to light. Conjunctivae and EOM are normal.  Neck: Normal range of motion. Neck supple.  Cardiovascular: Normal rate, regular rhythm and intact distal pulses.  Murmur heard. Extremely faint murmur heard He denies ever being told that he has a murmur  Pulmonary/Chest: Effort normal and breath sounds normal. No stridor. No respiratory distress. He has no wheezes. He has no rales. He exhibits no tenderness.  Abdominal: Soft. Bowel sounds are normal. He exhibits no distension and no mass. There is no  tenderness. There is no rebound and no guarding. No hernia.  Genitourinary:  Genitourinary Comments: Declined DRE/gential examination   Lymphadenopathy:    He has no cervical adenopathy.  Neurological: He is alert and oriented to person, place, and time.  Skin: Skin is warm and dry. Capillary refill takes less than 2 seconds. No rash noted. He is not diaphoretic. No erythema. No pallor.  Psychiatric: He has a normal mood and affect. His behavior is normal. Judgment and thought content normal.  Nursing note and vitals reviewed.     Assessment & Plan:   1. Screening for AAA (abdominal aortic aneurysm)   2. Need for influenza vaccination   3. Need for zoster vaccination   4. Encounter for screening for lung cancer   5. Encounter for screening for malignant neoplasm of respiratory organs   6. History of tobacco use   7. Healthcare maintenance   8. Hyperlipidemia, unspecified hyperlipidemia  type   9. Murmur, cardiac     Healthcare maintenance Continue all medications as directed. Increase water intake and follow heart healthy diet. Continue regular exercise. Please schedule fasting lab appt in 4 months-to check cholesterol panel and thyroid panel.  AAA Screening and LDCT ordered. Please schedule office visit follow-up in 6 months.  Hyperlipidemia The 10-year ASCVD risk score Mikey Bussing DC Jr., et al., 2013) is: 12.5%   Values used to calculate the score:     Age: 39 years     Sex: Male     Is Non-Hispanic African American: No     Diabetic: No     Tobacco smoker: No     Systolic Blood Pressure: 009 mmHg     Is BP treated: No     HDL Cholesterol: 67 mg/dL     Total Cholesterol: 187 mg/dL  LDL-103 Prefers to improve diet instead of increasing atorvastatin from 10mg  to 20mg  Will recheck in lipids 4 months  Murmur, cardiac Denies ever being told about cardiac murmur Able to play tennis for hrs without exhaustion Will monitor   History of tobacco use LDCT and AAA ordered   FOLLOW-UP:  Return for Regular Follow Up.

## 2018-04-17 ENCOUNTER — Encounter: Payer: Self-pay | Admitting: Adult Health

## 2018-04-17 ENCOUNTER — Ambulatory Visit (INDEPENDENT_AMBULATORY_CARE_PROVIDER_SITE_OTHER): Payer: Medicare Other | Admitting: Adult Health

## 2018-04-17 VITALS — BP 124/75 | HR 54 | Temp 98.2°F | Ht 68.5 in | Wt 193.3 lb

## 2018-04-17 DIAGNOSIS — R011 Cardiac murmur, unspecified: Secondary | ICD-10-CM | POA: Insufficient documentation

## 2018-04-17 DIAGNOSIS — Z87891 Personal history of nicotine dependence: Secondary | ICD-10-CM | POA: Diagnosis not present

## 2018-04-17 DIAGNOSIS — Z23 Encounter for immunization: Secondary | ICD-10-CM

## 2018-04-17 DIAGNOSIS — Z136 Encounter for screening for cardiovascular disorders: Secondary | ICD-10-CM | POA: Diagnosis not present

## 2018-04-17 DIAGNOSIS — Z122 Encounter for screening for malignant neoplasm of respiratory organs: Secondary | ICD-10-CM | POA: Diagnosis not present

## 2018-04-17 DIAGNOSIS — Z0000 Encounter for general adult medical examination without abnormal findings: Secondary | ICD-10-CM

## 2018-04-17 DIAGNOSIS — E785 Hyperlipidemia, unspecified: Secondary | ICD-10-CM | POA: Diagnosis not present

## 2018-04-17 DIAGNOSIS — E039 Hypothyroidism, unspecified: Secondary | ICD-10-CM | POA: Diagnosis not present

## 2018-04-17 MED ORDER — ZOSTER VAC RECOMB ADJUVANTED 50 MCG/0.5ML IM SUSR: 0.5000 mL | Freq: Once | INTRAMUSCULAR | 0 refills | Status: AC

## 2018-04-17 NOTE — Assessment & Plan Note (Signed)
LDCT and AAA ordered

## 2018-04-17 NOTE — Assessment & Plan Note (Signed)
The 10-year ASCVD risk score Mikey Bussing DC Brooke Bonito., et al., 2013) is: 12.5%   Values used to calculate the score:     Age: 67 years     Sex: Male     Is Non-Hispanic African American: No     Diabetic: No     Tobacco smoker: No     Systolic Blood Pressure: 067 mmHg     Is BP treated: No     HDL Cholesterol: 67 mg/dL     Total Cholesterol: 187 mg/dL  LDL-103 Prefers to improve diet instead of increasing atorvastatin from 10mg  to 20mg  Will recheck in lipids 4 months

## 2018-04-17 NOTE — Assessment & Plan Note (Signed)
Denies ever being told about cardiac murmur Able to play tennis for hrs without exhaustion Will monitor

## 2018-04-17 NOTE — Assessment & Plan Note (Signed)
Continue all medications as directed. Increase water intake and follow heart healthy diet. Continue regular exercise. Please schedule fasting lab appt in 4 months-to check cholesterol panel and thyroid panel.  AAA Screening and LDCT ordered. Please schedule office visit follow-up in 6 months.

## 2018-04-17 NOTE — Assessment & Plan Note (Signed)
TSH- 4.370 Currently on Levothyroxine 13mcg- has been on this dose for >10 years  Increased from 2.710 last year Will recheck TSH in 4  months

## 2018-04-17 NOTE — Patient Instructions (Addendum)

## 2018-04-24 ENCOUNTER — Telehealth: Payer: Self-pay | Admitting: Adult Health

## 2018-04-24 NOTE — Telephone Encounter (Signed)
Please advise.  T. Nyleah Mcginnis, CMA 

## 2018-04-24 NOTE — Telephone Encounter (Signed)
Patient called stating that at last [place] discussed possibly increasing his thyroid meds, patient has given it some thought and would like to move forward with this plan if possible. Patient is requesting a call back from clinic staff regarding this and can be reached on his mobile when available.

## 2018-04-25 ENCOUNTER — Other Ambulatory Visit: Payer: Self-pay | Admitting: Adult Health

## 2018-04-25 DIAGNOSIS — E039 Hypothyroidism, unspecified: Secondary | ICD-10-CM

## 2018-04-25 MED ORDER — LEVOTHYROXINE SODIUM 112 MCG PO TABS: 112.0000 ug | ORAL_TABLET | Freq: Every day | ORAL | 0 refills | Status: DC

## 2018-04-25 NOTE — Telephone Encounter (Signed)
Good Morning [name], Can you please Joshua Moreno and share- His last TSH- 4.370 which was a big jump for him and with recent increase in fatigue we will slightly increase Levothryoxixe dose from 100 to 127mcg one daily- rx sent in. Please have him schedule TSH lab appt in 6 weeks-future labs already input Thanks! [name]

## 2018-04-25 NOTE — Telephone Encounter (Signed)
MyChart message sent to pt with results.  Charyl Bigger, CMA

## 2018-04-26 ENCOUNTER — Ambulatory Visit
Admission: RE | Admit: 2018-04-26 | Discharge: 2018-04-26 | Disposition: A | Discharge status: Home / Self Care | Payer: Medicare Other | Source: Ambulatory Visit | Attending: Adult Health | Admitting: Adult Health

## 2018-04-26 DIAGNOSIS — Z136 Encounter for screening for cardiovascular disorders: Secondary | ICD-10-CM | POA: Diagnosis not present

## 2018-04-26 DIAGNOSIS — Z122 Encounter for screening for malignant neoplasm of respiratory organs: Secondary | ICD-10-CM

## 2018-04-26 DIAGNOSIS — F17210 Nicotine dependence, cigarettes, uncomplicated: Secondary | ICD-10-CM | POA: Diagnosis not present

## 2018-04-26 DIAGNOSIS — Z87891 Personal history of nicotine dependence: Secondary | ICD-10-CM

## 2018-05-02 ENCOUNTER — Other Ambulatory Visit: Payer: Self-pay | Admitting: Adult Health

## 2018-05-02 DIAGNOSIS — R011 Cardiac murmur, unspecified: Secondary | ICD-10-CM

## 2018-05-02 DIAGNOSIS — I700 Atherosclerosis of aorta: Secondary | ICD-10-CM

## 2018-05-02 NOTE — Progress Notes (Signed)
Cardiology referral placed- Recent LDCT results: IMPRESSION: 1. Lung-RADS 2, benign appearance or behavior. Continue annual screening with low-dose chest CT without contrast in 12 months. 2. Aortic Atherosclerosis (ICD10-I70.0) and Emphysema (ICD10-J43.9). 3. Coronary artery atherosclerotic calcifications.

## 2018-05-14 ENCOUNTER — Encounter: Payer: Self-pay | Admitting: Emergency Medicine

## 2018-05-14 ENCOUNTER — Ambulatory Visit
Admission: EM | Admit: 2018-05-14 | Discharge: 2018-05-14 | Disposition: A | Discharge status: Home / Self Care | Payer: Medicare Other | Attending: Physician Assistant | Admitting: Physician Assistant

## 2018-05-14 DIAGNOSIS — Z23 Encounter for immunization: Secondary | ICD-10-CM

## 2018-05-14 DIAGNOSIS — S0181XA Laceration without foreign body of other part of head, initial encounter: Secondary | ICD-10-CM | POA: Diagnosis not present

## 2018-05-14 DIAGNOSIS — W010XXA Fall on same level from slipping, tripping and stumbling without subsequent striking against object, initial encounter: Secondary | ICD-10-CM | POA: Diagnosis not present

## 2018-05-14 MED ORDER — MUPIROCIN 2 % EX OINT: 1.0000 "application " | TOPICAL_OINTMENT | Freq: Two times a day (BID) | CUTANEOUS | 0 refills | Status: DC

## 2018-05-14 MED ORDER — TETANUS-DIPHTH-ACELL PERTUSSIS 5-2.5-18.5 LF-MCG/0.5 IM SUSP
0.5000 mL | Freq: Once | INTRAMUSCULAR | Status: AC
  Administered 2018-05-14: 0.5 mL via INTRAMUSCULAR

## 2018-05-14 NOTE — ED Triage Notes (Signed)
Pt presents to [facility] for assessment of laceration to right forehead after falling on tennis court today (approx 1 hours ago).

## 2018-05-14 NOTE — ED Notes (Signed)
Patient able to ambulate independently  

## 2018-05-14 NOTE — Discharge Instructions (Signed)
Tetanus updated. 3 sutures placed. You can remove current dressing in 24 hours. Keep wound clean and dry. You can clean gently with soap and water. Do not soak area in water. Dress wound with bactroban ointment. Monitor for spreading redness, increased warmth, increased swelling, fever, follow up for reevaluation needed. If experiencing worsening headaches, blurry vision, vomiting, weakness, dizziness, passing out, go to the emergency department for further evaluation. Otherwise follow up in 5 days for suture removal.

## 2018-05-14 NOTE — ED Provider Notes (Signed)
[facility]    CSN: [id number] Arrival date & time: 05/14/18  1704     History   Chief Complaint Chief Complaint  Patient presents with  . Laceration    HPI Joshua Moreno is a 68 y.o. male.   68 year old male comes in for right forehead laceration after falling on the tennis court today. Hit his head and right knee. Denies LOC. Has some throbbing pain around the laceration. Denies blurry vision, photophobia, nausea, vomiting. Denies weakness, dizziness, syncope. Dressed the wound and came for evaluation. Last tetanus 2013     Past Medical History:  Diagnosis Date  . Hyperlipidemia   . Personal history of colonic polyps-adenoma 06/26/2008  . Skin cancer of face    Actinic keratoses; followed annually by [facility]  . Thyroid disease     Patient Active Problem List   Diagnosis Date Noted  . Murmur, cardiac 04/17/2018  . Healthcare maintenance 03/13/2018  . Hyperlipidemia 12/04/2011  . Hypothyroidism 12/04/2011  . RBBB 12/04/2011  . Overweight (BMI 25.0-29.9) 12/04/2011  . History of tobacco use 12/04/2011  . History of colonic polyps 06/26/2008    Past Surgical History:  Procedure Laterality Date  . HYDROCELE EXCISION / REPAIR     R scrotum  . tail bone         Home Medications    Prior to Admission medications   Medication Sig Start Date End Date Taking? Authorizing Provider  Ascorbic Acid (VITAMIN C) 100 MG tablet Take 200 mg by mouth daily.   Yes [provider]  aspirin EC 81 MG tablet Take 81 mg by mouth every other day.   Yes [provider]  atorvastatin (LIPITOR) 10 MG tablet Take 1 tablet (10 mg total) by mouth daily. 03/13/18  Yes Danford, [name] D, NP  levothyroxine (SYNTHROID, LEVOTHROID) 112 MCG tablet Take 1 tablet (112 mcg total) by mouth daily. 04/25/18  Yes Danford, [name] D, NP  pyridOXINE (VITAMIN B-6) 100 MG tablet Take 100 mg by mouth daily.   Yes [provider]  mupirocin ointment  (BACTROBAN) 2 % Apply 1 application topically 2 (two) times daily. 05/14/18   Ok Edwards, PA-C    Family History Family History  Problem Relation Age of Onset  . Heart disease Father 38       CABG/CAD  . Alzheimer's disease Mother   . Colon cancer Neg Hx   . Esophageal cancer Neg Hx   . Rectal cancer Neg Hx   . Stomach cancer Neg Hx     Social History Social History   Tobacco Use  . Smoking status: Former Smoker    Packs/day: 1.00    Years: 35.00    Pack years: 35.00    Types: Cigarettes    Last attempt to quit: 12/07/2003    Years since quitting: 14.4  . Smokeless tobacco: Never Used  Substance Use Topics  . Alcohol use: Yes    Alcohol/week: 16.0 standard drinks    Types: 6 Standard drinks or equivalent, 4 Cans of beer, 6 Shots of liquor per week  . Drug use: No     Allergies   Cinnamon   Review of Systems Review of Systems  Reason unable to perform ROS: See HPI as above.     Physical Exam Triage Vital Signs ED Triage Vitals [05/14/18 1717]  Enc Vitals Group     BP (!) 169/94     Pulse Rate (!) 54     Resp 18  Temp 97.7 F (36.5 C)     Temp Source Oral     SpO2 96 %     Weight      Height      Head Circumference      Peak Flow      Pain Score 1     Pain Loc      Pain Edu?      Excl. in [place]?    No data found.  Updated Vital Signs BP (!) 169/94 (BP Location: Left Arm)   Pulse (!) 54   Temp 97.7 F (36.5 C) (Oral)   Resp 18   SpO2 96%   Physical Exam Constitutional:      General: Joshua Moreno is not in acute distress.    Appearance: Joshua Moreno is well-developed. Joshua Moreno is not ill-appearing, toxic-appearing or diaphoretic.  HENT:     Head: Normocephalic and atraumatic.  Eyes:     General: Lids are normal.     Extraocular Movements: Extraocular movements intact.     Conjunctiva/sclera: Conjunctivae normal.     Pupils: Pupils are equal, round, and reactive to light.     Comments: 1.5cm laceration to the right temporal. Surrounding abrasion. Bleeding controlled  by pressure.   Neurological:     General: No focal deficit present.     Mental Status: Joshua Moreno is alert and oriented to person, place, and time.     GCS: GCS eye subscore is 4. GCS verbal subscore is 5. GCS motor subscore is 6.     Cranial Nerves: Cranial nerves are intact.     Sensory: Sensation is intact.     Motor: Motor function is intact.     Coordination: Coordination is intact.     Gait: Gait is intact.      UC Treatments / Results  Labs (all labs ordered are listed, but only abnormal results are displayed) Labs Reviewed - No data to display  EKG None  Radiology No results found.  Procedures Laceration Repair Date/Time: 05/14/2018 6:21 PM Performed by: Ok Edwards, PA-C Authorized by: Ok Edwards, PA-C   Consent:    Consent obtained:  Verbal   Consent given by:  Patient   Risks discussed:  Infection, pain, poor cosmetic result, retained foreign body, need for additional repair, poor wound healing and nerve damage   Alternatives discussed:  No treatment and referral Anesthesia (see MAR for exact dosages):    Anesthesia method:  Local infiltration   Local anesthetic:  Lidocaine 2% WITH epi Laceration details:    Location:  Face   Face location:  Forehead   Length (cm):  1.5   Depth (mm):  5 Repair type:    Repair type:  Simple Pre-procedure details:    Preparation:  Patient was prepped and draped in usual sterile fashion Exploration:    Hemostasis achieved with:  Epinephrine and direct pressure   Wound exploration: entire depth of wound probed and visualized   Treatment:    Area cleansed with:  Hibiclens   Amount of cleaning:  Standard   Irrigation solution:  Sterile saline   Irrigation method:  Pressure wash   Visualized foreign bodies/material removed: no   Skin repair:    Repair method:  Sutures   Suture size:  6-0   Suture material:  Prolene   Suture technique:  Simple interrupted   Number of sutures:  3 Approximation:    Approximation:   Close Post-procedure details:    Dressing:  Antibiotic ointment and bulky dressing  Patient tolerance of procedure:  Tolerated well, no immediate complications   (including critical care time)  Medications Ordered in UC Medications  Tdap (BOOSTRIX) injection 0.5 mL (0.5 mLs Intramuscular Given 05/14/18 1756)    Initial Impression / Assessment and Plan / UC Course  I have reviewed the triage vital signs and the nursing notes.  Pertinent labs & imaging results that were available during my care of the patient were reviewed by me and considered in my medical decision making (see chart for details).    Discussed with patient cannot rule out bleeding given age and head injury. Risks and benefits discussed. Patient without alarming signs, normal neurology exam at this time. Joshua Moreno would like to defer ED visit for CT scan and expresses understanding of risk.  Tetanus updated today. Patient tolerated procedure well. 3 sutures placed. Wound care instructions given. Return precautions given. Otherwise, follow up in 5 days for suture removal. Patient expresses understanding and agrees to plan.   Final Clinical Impressions(s) / UC Diagnoses   Final diagnoses:  Laceration of forehead, initial encounter    ED Prescriptions    Medication Sig Dispense Auth. Provider   mupirocin ointment (BACTROBAN) 2 % Apply 1 application topically 2 (two) times daily. 22 g Tobin Chad, [place] 05/14/18 1824

## 2018-05-20 ENCOUNTER — Ambulatory Visit
Admission: EM | Admit: 2018-05-20 | Discharge: 2018-05-20 | Disposition: A | Discharge status: Home / Self Care | Payer: Medicare Other

## 2018-05-20 NOTE — ED Triage Notes (Signed)
Pt here for suture removal. Removed 3 sutures above rt eye. No drainage or discoloration noted

## 2018-06-04 ENCOUNTER — Other Ambulatory Visit (INDEPENDENT_AMBULATORY_CARE_PROVIDER_SITE_OTHER): Payer: Medicare Other

## 2018-06-04 DIAGNOSIS — E039 Hypothyroidism, unspecified: Secondary | ICD-10-CM | POA: Diagnosis not present

## 2018-06-05 LAB — TSH: TSH: 1.02 u[IU]/mL (ref 0.450–4.500)

## 2018-06-12 DIAGNOSIS — H524 Presbyopia: Secondary | ICD-10-CM | POA: Diagnosis not present

## 2018-06-12 DIAGNOSIS — H5203 Hypermetropia, bilateral: Secondary | ICD-10-CM | POA: Diagnosis not present

## 2018-06-12 DIAGNOSIS — H2513 Age-related nuclear cataract, bilateral: Secondary | ICD-10-CM | POA: Diagnosis not present

## 2018-06-12 DIAGNOSIS — H52223 Regular astigmatism, bilateral: Secondary | ICD-10-CM | POA: Diagnosis not present

## 2018-06-18 ENCOUNTER — Encounter: Payer: Self-pay | Admitting: Cardiovascular Disease

## 2018-06-18 ENCOUNTER — Ambulatory Visit (INDEPENDENT_AMBULATORY_CARE_PROVIDER_SITE_OTHER): Payer: Medicare Other | Admitting: Cardiovascular Disease

## 2018-06-18 VITALS — BP 132/84 | HR 47 | Ht 68.5 in | Wt 195.8 lb

## 2018-06-18 DIAGNOSIS — I4510 Unspecified right bundle-branch block: Secondary | ICD-10-CM

## 2018-06-18 DIAGNOSIS — E782 Mixed hyperlipidemia: Secondary | ICD-10-CM | POA: Diagnosis not present

## 2018-06-18 MED ORDER — ROSUVASTATIN CALCIUM 10 MG PO TABS: 10.0000 mg | ORAL_TABLET | Freq: Every day | ORAL | 3 refills | Status: DC

## 2018-06-18 NOTE — Patient Instructions (Signed)
Medication Instructions:  Your physician has recommended you make the following change in your medication:  STOP Atorvastatin (Lipitor) START Rosuvastatin (Crestor) 10 mg once daily  If you need a refill on your cardiac medications before your next appointment, please call your pharmacy.    Lab work: Your physician recommends that you return for lab work in: 3 months  You will need to FAST for this appointment - nothing to eat or drink after midnight the night before except water.     Testing/Procedures: None Ordered    Follow-Up: At [facility], you and your health needs are our priority.  As part of our continuing mission to provide you with exceptional heart care, we have created designated Provider Care Teams.  These Care Teams include your primary Cardiologist (physician) and Advanced Practice Providers (APPs -  Physician Assistants and Nurse Practitioners) who all work together to provide you with the care you need, when you need it. You will need a follow up appointment in:  1 years.  Please call our office 2 months in advance to schedule this appointment.  You may see Mertie Moores, MD or one of the following Advanced Practice Providers on your designated Care Team: Richardson Dopp, PA-C [place], [place] . Daune Perch, NP

## 2018-06-18 NOTE — Progress Notes (Signed)
Cardiology Office Note:    Date:  06/18/2018   ID:  Joshua Moreno, DOB [birth year], MRN [id number]  PCP:  Esaw Grandchild, NP  Cardiologist:  Mertie Moores, MD  Electrophysiologist:  None   Referring MD: Esaw Grandchild, NP   1.  Problem list 1.  Coronary artery calcifications 2.  Right bundle branch block 3.  Hyperlipidemia 4.  Hypothyroidism  Chief Complaint  Patient presents with  . Coronary Artery Disease    coronary artery calcifications     Feb. 11, 2020    Joshua Moreno is a 68 y.o. male with a hx of hyperlipidemia and right bundle branch block.  He recently had a CT of the chest for cancer screening and was found to have coronary artery calcifications.   Denies any chest pain or shortness of breath.  He plays competitive tennis 4 times a week.  Can play for 2 hours a day in the summer .    Retired in the Control and instrumentation engineer business.    Lots of volunteer work at [employer] No syncope  Has been on atorvastatin for 10 years . Lipids look okay.  His LDL is 103.  Total cholesterol is 187.  HDL is 67.  Triglyceride level is 87.   Past Medical History:  Diagnosis Date  . Hyperlipidemia   . Personal history of colonic polyps-adenoma 06/26/2008  . Skin cancer of face    Actinic keratoses; followed annually by [facility]  . Thyroid disease     Past Surgical History:  Procedure Laterality Date  . HYDROCELE EXCISION / REPAIR     R scrotum  . tail bone      Current Medications: Current Meds  Medication Sig  . Ascorbic Acid (VITAMIN C) 100 MG tablet Take 200 mg by mouth daily.  Marland Kitchen aspirin EC 81 MG tablet Take 81 mg by mouth every other day.  . levothyroxine (SYNTHROID, LEVOTHROID) 112 MCG tablet Take 1 tablet (112 mcg total) by mouth daily.  Marland Kitchen pyridOXINE (VITAMIN B-6) 100 MG tablet Take 100 mg by mouth daily.  . [DISCONTINUED] atorvastatin (LIPITOR) 10 MG tablet Take 1 tablet (10 mg total) by mouth daily.     Allergies:   Cinnamon   Social History    Socioeconomic History  . Marital status: Married    Spouse name: Not on file  . Number of children: Not on file  . Years of education: Not on file  . Highest education level: Not on file  Occupational History  . Occupation: retired  Scientific laboratory technician  . Financial resource strain: Not on file  . Food insecurity:    Worry: Not on file    Inability: Not on file  . Transportation needs:    Medical: Not on file    Non-medical: Not on file  Tobacco Use  . Smoking status: Former Smoker    Packs/day: 1.00    Years: 35.00    Pack years: 35.00    Types: Cigarettes    Last attempt to quit: 12/07/2003    Years since quitting: 14.5  . Smokeless tobacco: Never Used  Substance and Sexual Activity  . Alcohol use: Yes    Alcohol/week: 16.0 standard drinks    Types: 6 Standard drinks or equivalent, 4 Cans of beer, 6 Shots of liquor per week  . Drug use: No  . Sexual activity: Yes    Birth control/protection: None  Lifestyle  . Physical activity:    Days per week: Not on file  Minutes per session: Not on file  . Stress: Not on file  Relationships  . Social connections:    Talks on phone: Not on file    Gets together: Not on file    Attends religious service: Not on file    Active member of club or organization: Not on file    Attends meetings of clubs or organizations: Not on file    Relationship status: Not on file  Other Topics Concern  . 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      Tobacco: quit in 2000.  1 ppd x 30 years.      Alcohol:  Beer or bourbon daily; 5-8 drinks per week.        Exercise:  Plays tennis four days weekly. [employer].      ADLs: independent. Drives.      Advanced Directives: FULL CODE; no prolonged resuscitation.        Seatbelt: 100%; no texting while driving.              Family History: The patient's family history includes Alzheimer's disease in his mother; Heart disease (age of onset: 22) in his father. There is no history of Colon cancer, Esophageal cancer, Rectal cancer, or Stomach cancer.    ROS:   Please see the history of present illness.     All other systems reviewed and are negative.  EKGs/Labs/Other Studies Reviewed:    The following studies were reviewed today:   EKG:   June 18, 2018: Sinus bradycardia at 47 beats a minute.  He has a right bundle branch block.  PR interval is 166 ms.  Right bundle branch block is old.  Recent Labs: 04/09/2018: ALT 36; BUN 17; Creatinine, Ser 1.20; Hemoglobin 15.5; Platelets 267; Potassium 4.6; Sodium 146 06/04/2018: TSH 1.020  Recent Lipid Panel    Component Value Date/Time   CHOL 187 04/09/2018 0954   TRIG 87 04/09/2018 0954   HDL 67 04/09/2018 0954   CHOLHDL 2.8 04/09/2018 0954   CHOLHDL 2.1 01/11/2016 0819   VLDL 17 01/11/2016 0819   LDLCALC 103 (H) 04/09/2018 0954    Physical Exam:    VS:  BP 132/84   Pulse (!) 47   Ht 5' 8.5" (1.74 m)   Wt 195 lb 12.8 oz (88.8 kg)   SpO2 98%   BMI 29.34 kg/m     Wt Readings from Last 3 Encounters:  06/18/18 195 lb 12.8 oz (88.8 kg)  04/17/18 193 lb 4.8 oz (87.7 kg)  03/13/18 194 lb 6.4 oz (88.2 kg)     GEN:   Middle age man  HEENT: Normal NECK: No JVD; No carotid bruits LYMPHATICS: No lymphadenopathy CARDIAC:  RR ,  Brady  RESPIRATORY:  Clear to auscultation without rales, wheezing or rhonchi  ABDOMEN: Soft, non-tender, non-distended MUSCULOSKELETAL:  No edema; No deformity  SKIN: Warm and dry NEUROLOGIC:  Alert and oriented x 3 PSYCHIATRIC:  Normal affect   ASSESSMENT:    1. Right bundle branch block (RBBB)   2. Mixed hyperlipidemia    PLAN:    In order of problems listed above:  1.  Coronary Artery disease:   Has documentation of coronary artery calcifications.  He is completely  asymptomatic.  He plays competitive tennis 4  times a week.  LDL still around 100.  I think that he should have an LDL of around 70.  We will stop the atorvastatin and start him on rosuvastatin 10 mg a day. Neck is repeat labs in 3 months.  I will see him again in 1 year for follow-up visit.  2.  Right bundle branch block: Stable    Medication Adjustments/Labs and Tests Ordered: Current medicines are reviewed at length with the patient today.  Concerns regarding medicines are outlined above.  Orders Placed This Encounter  Procedures  . Lipid Profile  . Basic Metabolic Panel (BMET)  . Hepatic function panel  . EKG 12-Lead   Meds ordered this encounter  Medications  . rosuvastatin (CRESTOR) 10 MG tablet    Sig: Take 1 tablet (10 mg total) by mouth daily.    Dispense:  90 tablet    Refill:  3   Patient Instructions  Medication Instructions:  Your physician has recommended you make the following change in your medication:  STOP Atorvastatin (Lipitor) START Rosuvastatin (Crestor) 10 mg once daily  If you need a refill on your cardiac medications before your next appointment, please call your pharmacy.    Lab work: Your physician recommends that you return for lab work in: 3 months  You will need to FAST for this appointment - nothing to eat or drink after midnight the night before except water.     Testing/Procedures: None Ordered    Follow-Up: At [facility], you and your health needs are our priority.  As part of our continuing mission to provide you with exceptional heart care, we have created designated Provider Care Teams.  These Care Teams include your primary Cardiologist (physician) and Advanced Practice Providers (APPs -  Physician Assistants and Nurse Practitioners) who all work together to provide you with the care you need, when you need it. You will need a follow up appointment in:  1 years.  Please call our office 2 months in advance to schedule this appointment.  You may see Mertie Moores, MD or one  of the following Advanced Practice Providers on your designated Care Team: Richardson Dopp, PA-C [place], [place] . Daune Perch, NP       Signed, Mertie Moores, MD  06/18/2018 5:33 PM    [place]

## 2018-06-20 ENCOUNTER — Other Ambulatory Visit: Payer: Self-pay | Admitting: *Deleted

## 2018-06-26 DIAGNOSIS — Z85820 Personal history of malignant melanoma of skin: Secondary | ICD-10-CM | POA: Diagnosis not present

## 2018-06-26 DIAGNOSIS — D485 Neoplasm of uncertain behavior of skin: Secondary | ICD-10-CM | POA: Diagnosis not present

## 2018-06-26 DIAGNOSIS — D1801 Hemangioma of skin and subcutaneous tissue: Secondary | ICD-10-CM | POA: Diagnosis not present

## 2018-06-26 DIAGNOSIS — L820 Inflamed seborrheic keratosis: Secondary | ICD-10-CM | POA: Diagnosis not present

## 2018-06-26 DIAGNOSIS — D2261 Melanocytic nevi of right upper limb, including shoulder: Secondary | ICD-10-CM | POA: Diagnosis not present

## 2018-06-26 DIAGNOSIS — L570 Actinic keratosis: Secondary | ICD-10-CM | POA: Diagnosis not present

## 2018-06-26 DIAGNOSIS — D225 Melanocytic nevi of trunk: Secondary | ICD-10-CM | POA: Diagnosis not present

## 2018-06-26 DIAGNOSIS — Z85828 Personal history of other malignant neoplasm of skin: Secondary | ICD-10-CM | POA: Diagnosis not present

## 2018-06-26 DIAGNOSIS — L821 Other seborrheic keratosis: Secondary | ICD-10-CM | POA: Diagnosis not present

## 2018-06-26 DIAGNOSIS — D2262 Melanocytic nevi of left upper limb, including shoulder: Secondary | ICD-10-CM | POA: Diagnosis not present

## 2018-07-10 ENCOUNTER — Encounter: Payer: Self-pay | Admitting: Internal Medicine

## 2018-08-06 ENCOUNTER — Telehealth: Payer: Self-pay | Admitting: Adult Health

## 2018-08-06 MED ORDER — LEVOTHYROXINE SODIUM 112 MCG PO TABS: 112.0000 ug | ORAL_TABLET | Freq: Every day | ORAL | 1 refills | Status: DC

## 2018-08-06 NOTE — Addendum Note (Signed)
Addended by: Fonnie Mu on: 08/06/2018 11:56 AM   Modules accepted: Orders

## 2018-08-06 NOTE — Telephone Encounter (Signed)
LVM informing pt of RX sent to [facility].  Charyl Bigger, CMA

## 2018-08-06 NOTE — Telephone Encounter (Signed)
Patient called stating that his thyroid med dosage was increased but that his mail order pharm [facility] does not have an updated order for this med. Can we please send this order for the patient?

## 2018-08-20 ENCOUNTER — Other Ambulatory Visit: Payer: Medicare Other

## 2018-09-23 ENCOUNTER — Other Ambulatory Visit: Payer: Medicare Other | Admitting: *Deleted

## 2018-09-23 ENCOUNTER — Other Ambulatory Visit: Payer: Self-pay

## 2018-09-23 DIAGNOSIS — I4510 Unspecified right bundle-branch block: Secondary | ICD-10-CM | POA: Diagnosis not present

## 2018-09-23 DIAGNOSIS — E782 Mixed hyperlipidemia: Secondary | ICD-10-CM

## 2018-09-23 LAB — BASIC METABOLIC PANEL
BUN/Creatinine Ratio: 17 (ref 10–24)
BUN: 19 mg/dL (ref 8–27)
CO2: 24 mmol/L (ref 20–29)
Calcium: 9.7 mg/dL (ref 8.6–10.2)
Chloride: 102 mmol/L (ref 96–106)
Creatinine, Ser: 1.14 mg/dL (ref 0.76–1.27)
GFR calc Af Amer: 75 mL/min/{1.73_m2} (ref >59)
GFR calc non Af Amer: 65 mL/min/{1.73_m2} (ref >59)
Glucose: 110 mg/dL — ABNORMAL HIGH (ref 65–99)
Potassium: 4.1 mmol/L (ref 3.5–5.2)
Sodium: 140 mmol/L (ref 134–144)

## 2018-09-23 LAB — HEPATIC FUNCTION PANEL
ALT: 46 IU/L — ABNORMAL HIGH (ref 0–44)
AST: 37 IU/L (ref 0–40)
Albumin: 4.1 g/dL (ref 3.8–4.8)
Alkaline Phosphatase: 77 IU/L (ref 39–117)
Bilirubin Total: 0.8 mg/dL (ref 0.0–1.2)
Bilirubin, Direct: 0.22 mg/dL (ref 0.00–0.40)
Total Protein: 6.2 g/dL (ref 6.0–8.5)

## 2018-09-23 LAB — LIPID PANEL
Chol/HDL Ratio: 2.2 ratio (ref 0.0–5.0)
Cholesterol, Total: 182 mg/dL (ref 100–199)
HDL: 81 mg/dL (ref >39)
LDL Calculated: 84 mg/dL (ref 0–99)
Triglycerides: 87 mg/dL (ref 0–149)
VLDL Cholesterol Cal: 17 mg/dL (ref 5–40)

## 2018-09-24 ENCOUNTER — Other Ambulatory Visit: Payer: Medicare Other

## 2018-10-17 ENCOUNTER — Ambulatory Visit: Payer: Medicare Other | Admitting: Adult Health

## 2018-10-23 ENCOUNTER — Telehealth: Payer: Self-pay | Admitting: Adult Health

## 2018-10-23 NOTE — Telephone Encounter (Signed)
Forwarding message to Dorothea Ogle to review / research pt's 04/17/2018 for any coding discrepancies.  --glh

## 2018-10-24 ENCOUNTER — Ambulatory Visit: Payer: Medicare Other | Admitting: Adult Health

## 2019-02-06 DIAGNOSIS — D1801 Hemangioma of skin and subcutaneous tissue: Secondary | ICD-10-CM | POA: Diagnosis not present

## 2019-02-06 DIAGNOSIS — L814 Other melanin hyperpigmentation: Secondary | ICD-10-CM | POA: Diagnosis not present

## 2019-02-06 DIAGNOSIS — L570 Actinic keratosis: Secondary | ICD-10-CM | POA: Diagnosis not present

## 2019-02-06 DIAGNOSIS — L821 Other seborrheic keratosis: Secondary | ICD-10-CM | POA: Diagnosis not present

## 2019-02-06 DIAGNOSIS — D692 Other nonthrombocytopenic purpura: Secondary | ICD-10-CM | POA: Diagnosis not present

## 2019-02-06 DIAGNOSIS — D485 Neoplasm of uncertain behavior of skin: Secondary | ICD-10-CM | POA: Diagnosis not present

## 2019-02-06 DIAGNOSIS — D225 Melanocytic nevi of trunk: Secondary | ICD-10-CM | POA: Diagnosis not present

## 2019-02-06 DIAGNOSIS — Z85820 Personal history of malignant melanoma of skin: Secondary | ICD-10-CM | POA: Diagnosis not present

## 2019-02-11 ENCOUNTER — Encounter: Payer: Self-pay | Admitting: Internal Medicine

## 2019-02-14 DIAGNOSIS — Z23 Encounter for immunization: Secondary | ICD-10-CM | POA: Diagnosis not present

## 2019-03-03 ENCOUNTER — Other Ambulatory Visit: Payer: Self-pay

## 2019-03-03 ENCOUNTER — Ambulatory Visit (AMBULATORY_SURGERY_CENTER): Payer: Self-pay | Admitting: *Deleted

## 2019-03-03 VITALS — Temp 98.1°F | Ht 68.5 in | Wt 192.8 lb

## 2019-03-03 DIAGNOSIS — Z1159 Encounter for screening for other viral diseases: Secondary | ICD-10-CM

## 2019-03-03 DIAGNOSIS — Z86010 Personal history of colonic polyps: Secondary | ICD-10-CM

## 2019-03-03 NOTE — Progress Notes (Signed)
No egg or soy allergy known to patient  No issues with past sedation with any surgeries  or procedures, no intubation problems  No diet pills per patient No home 02 use per patient  No blood thinners per patient  Pt denies issues with constipation  No A fib or A flutter  EMMI video sent to pt's e mail   Due to the COVID-19 pandemic we are asking patients to follow these guidelines. Please only bring one care partner. Please be aware that your care partner may wait in the car in the parking lot or if they feel like they will be too hot to wait in the car, they may wait in the lobby on the 4th floor. All care partners are required to wear a mask the entire time (we do not have any that we can provide them), they need to practice social distancing, and we will do a Covid check for all patient's and care partners when you arrive. Also we will check their temperature and your temperature. If the care partner waits in their car they need to stay in the parking lot the entire time and we will call them on their cell phone when the patient is ready for discharge so they can bring the car to the front of the building. Also all patient's will need to wear a mask into building.  covid screening 03/12/19,12:30 pm  Sheet provided for otc miralax prep.

## 2019-03-12 ENCOUNTER — Telehealth: Payer: Self-pay | Admitting: Adult Health

## 2019-03-12 ENCOUNTER — Other Ambulatory Visit: Payer: Self-pay | Admitting: Internal Medicine

## 2019-03-12 DIAGNOSIS — Z1159 Encounter for screening for other viral diseases: Secondary | ICD-10-CM | POA: Diagnosis not present

## 2019-03-12 LAB — SARS CORONAVIRUS 2 (TAT 6-24 HRS): SARS Coronavirus 2: NEGATIVE

## 2019-03-12 MED ORDER — LEVOTHYROXINE SODIUM 112 MCG PO TABS: 112.0000 ug | ORAL_TABLET | Freq: Every day | ORAL | 0 refills | Status: DC

## 2019-03-12 NOTE — Telephone Encounter (Signed)
Patient is requesting a refill of his thyroid med, if approved please send to [facility].

## 2019-03-12 NOTE — Telephone Encounter (Signed)
Medication sent to pharmacy for 90 day supply. AS, CMA

## 2019-03-12 NOTE — Addendum Note (Signed)
Addended by: Mickel Crow on: 03/12/2019 04:02 PM   Modules accepted: Orders

## 2019-03-17 ENCOUNTER — Other Ambulatory Visit: Payer: Self-pay

## 2019-03-17 ENCOUNTER — Encounter: Payer: Self-pay | Admitting: Internal Medicine

## 2019-03-17 ENCOUNTER — Ambulatory Visit (AMBULATORY_SURGERY_CENTER): Payer: Medicare Other | Admitting: Internal Medicine

## 2019-03-17 VITALS — BP 133/67 | HR 44 | Temp 98.8°F | Resp 14 | Ht 68.5 in | Wt 193.0 lb

## 2019-03-17 DIAGNOSIS — D120 Benign neoplasm of cecum: Secondary | ICD-10-CM | POA: Diagnosis not present

## 2019-03-17 DIAGNOSIS — E039 Hypothyroidism, unspecified: Secondary | ICD-10-CM | POA: Diagnosis not present

## 2019-03-17 DIAGNOSIS — E785 Hyperlipidemia, unspecified: Secondary | ICD-10-CM | POA: Diagnosis not present

## 2019-03-17 DIAGNOSIS — Z86010 Personal history of colonic polyps: Secondary | ICD-10-CM | POA: Diagnosis not present

## 2019-03-17 DIAGNOSIS — Z1211 Encounter for screening for malignant neoplasm of colon: Secondary | ICD-10-CM | POA: Diagnosis not present

## 2019-03-17 DIAGNOSIS — D122 Benign neoplasm of ascending colon: Secondary | ICD-10-CM | POA: Diagnosis not present

## 2019-03-17 DIAGNOSIS — E669 Obesity, unspecified: Secondary | ICD-10-CM | POA: Diagnosis not present

## 2019-03-17 MED ORDER — SODIUM CHLORIDE 0.9 % IV SOLN: 500.0000 mL | Freq: Once | INTRAVENOUS | Status: DC

## 2019-03-17 NOTE — Patient Instructions (Addendum)
I found and removed 6 tiny polyps. No signs of cancer.  I will let you know pathology results and when to have another routine colonoscopy by mail and/or My Chart.  I appreciate the opportunity to care for you. Gatha Mayer, MD, FACG  YOU HAD AN ENDOSCOPIC PROCEDURE TODAY AT [facility]:   Refer to the procedure report that was given to you for any specific questions about what was found during the examination.  If the procedure report does not answer your questions, please call your gastroenterologist to clarify.  If you requested that your care partner not be given the details of your procedure findings, then the procedure report has been included in a sealed envelope for you to review at your convenience later.  **Handout given on Polyps**   YOU SHOULD EXPECT: Some feelings of bloating in the abdomen. Passage of more gas than usual.  Walking can help get rid of the air that was put into your GI tract during the procedure and reduce the bloating. If you had a lower endoscopy (such as a colonoscopy or flexible sigmoidoscopy) you may notice spotting of blood in your stool or on the toilet paper. If you underwent a bowel prep for your procedure, you may not have a normal bowel movement for a few days.  Please Note:  You might notice some irritation and congestion in your nose or some drainage.  This is from the oxygen used during your procedure.  There is no need for concern and it should clear up in a day or so.  SYMPTOMS TO REPORT IMMEDIATELY:   Following lower endoscopy (colonoscopy or flexible sigmoidoscopy):  Excessive amounts of blood in the stool  Significant tenderness or worsening of abdominal pains  Swelling of the abdomen that is new, acute  Fever of 100F or higher  For urgent or emergent issues, a gastroenterologist can be reached at any hour by calling [phone].   DIET:  We do recommend a small meal at first, but then you may proceed to your regular  diet.  Drink plenty of fluids but you should avoid alcoholic beverages for 24 hours.  ACTIVITY:  You should plan to take it easy for the rest of today and you should NOT DRIVE or use heavy machinery until tomorrow (because of the sedation medicines used during the test).    FOLLOW UP: Our staff will call the number listed on your records 48-72 hours following your procedure to check on you and address any questions or concerns that you may have regarding the information given to you following your procedure. If we do not reach you, we will leave a message.  We will attempt to reach you two times.  During this call, we will ask if you have developed any symptoms of COVID 19. If you develop any symptoms (ie: fever, flu-like symptoms, shortness of breath, cough etc.) before then, please call [phone].  If you test positive for Covid 19 in the 2 weeks post procedure, please call and report this information to [place].    If any biopsies were taken you will be contacted by phone or by letter within the next 1-3 weeks.  Please call us at [phone] if you have not heard about the biopsies in 3 weeks.    SIGNATURES/CONFIDENTIALITY: You and/or your care partner have signed paperwork which will be entered into your electronic medical record.  These signatures attest to the fact that that the information above on your After  Visit Summary has been reviewed and is understood.  Full responsibility of the confidentiality of this discharge information lies with you and/or your care-partner. 

## 2019-03-17 NOTE — Progress Notes (Signed)
Report to PACU, RN, vss, BBS= Clear.  

## 2019-03-17 NOTE — Op Note (Signed)
Joshua Moreno Patient Name: Joshua Moreno Procedure Date: 03/17/2019 10:18 AM MRN: [id number] Endoscopist: Gatha Mayer , MD Age: 68 Referring MD:  Date of Birth: 1950/07/30 Gender: Male Account #: [id number] Procedure:                Colonoscopy Indications:              Surveillance: Personal history of adenomatous                            polyps on last colonoscopy > 3 years ago Medicines:                Propofol per Anesthesia, Monitored Anesthesia Care Procedure:                Pre-Anesthesia Assessment:                           - Prior to the procedure, a History and Physical                            was performed, and patient medications and                            allergies were reviewed. The patient's tolerance of                            previous anesthesia was also reviewed. The risks                            and benefits of the procedure and the sedation                            options and risks were discussed with the patient.                            All questions were answered, and informed consent                            was obtained. Prior Anticoagulants: The patient has                            taken no previous anticoagulant or antiplatelet                            agents. ASA Grade Assessment: II - A patient with                            mild systemic disease. After reviewing the risks                            and benefits, the patient was deemed in                            satisfactory condition to undergo the procedure.  After obtaining informed consent, the colonoscope                            was passed under direct vision. Throughout the                            procedure, the patient's blood pressure, pulse, and                            oxygen saturations were monitored continuously. The                            Colonoscope was introduced through the anus and   advanced to the the cecum, identified by                            appendiceal orifice and ileocecal valve. The                            colonoscopy was performed without difficulty. The                            patient tolerated the procedure well. The quality                            of the bowel preparation was excellent. The bowel                            preparation used was Miralax via split dose                            instruction. The ileocecal valve, appendiceal                            orifice, and rectum were photographed. Scope In: 10:22:37 AM Scope Out: 10:34:07 AM Scope Withdrawal Time: 0 hours 10 minutes 8 seconds  Total Procedure Duration: 0 hours 11 minutes 30 seconds  Findings:                 The perianal and digital rectal examinations were                            normal. Pertinent negatives include normal prostate                            (size, shape, and consistency).                           Six sessile polyps were found in the ascending                            colon and cecum. The polyps were diminutive in                            size. These polyps were removed  with a cold snare.                            Resection and retrieval were complete. Verification                            of patient identification for the specimen was                            done. Estimated blood loss was minimal.                           The exam was otherwise without abnormality on                            direct and retroflexion views. Complications:            No immediate complications. Estimated Blood Loss:     Estimated blood loss was minimal. Impression:               - Six diminutive polyps in the ascending colon and                            in the cecum, removed with a cold snare. Resected                            and retrieved.                           - The examination was otherwise normal on direct                            and  retroflexion views. except for tattoos marking                            prior polypectomy site (no residual/recurrent polyp)                           - Personal history of colonic polyps.                           - 06/2008 - 7 mm adenoma                           - 06/04/2014 Half-circumference flat-sessile polyp                            removed/ablated (TV adenoma) and 7 mm and 10 mm                            transverse polyps removed (tubular adenomas)                           - 04/13/2015 sigmoidoscopy:no residual polyp seen -  bx = ; diminutive descending polyp snared - no                            residual polyps - + adenoma Recommendation:           - Patient has a contact number available for                            emergencies. The signs and symptoms of potential                            delayed complications were discussed with the                            patient. Return to normal activities tomorrow.                            Written discharge instructions were provided to the                            patient.                           - Resume previous diet.                           - Continue present medications.                           - Repeat colonoscopy is recommended for                            surveillance. The colonoscopy date will be                            determined after pathology results from today's                            exam become available for review. Gatha Mayer, MD 03/17/2019 10:44:43 AM This report has been signed electronically.

## 2019-03-17 NOTE — Progress Notes (Signed)
[facility] Temp-JB  Pt's states no medical or surgical changes since previsit or office visit. 

## 2019-03-17 NOTE — Progress Notes (Signed)
Called to room to assist during endoscopic procedure.  Patient ID and intended procedure confirmed with present staff. Received instructions for my participation in the procedure from the performing physician.  

## 2019-03-18 ENCOUNTER — Other Ambulatory Visit: Payer: Self-pay

## 2019-03-18 MED ORDER — LEVOTHYROXINE SODIUM 112 MCG PO TABS: 112.0000 ug | ORAL_TABLET | Freq: Every day | ORAL | 0 refills | Status: DC

## 2019-03-18 NOTE — Telephone Encounter (Signed)
Received fax from [facility] requesting RX for levothyroxine d/t pt now using mail order pharmacy.  New RX sent to pharmacy.  Charyl Bigger, CMA

## 2019-03-19 ENCOUNTER — Telehealth: Payer: Self-pay | Admitting: *Deleted

## 2019-03-19 NOTE — Telephone Encounter (Signed)
  Follow up Call-  Call back number 03/17/2019  Post procedure Call Back phone  # [phone]  Permission to leave phone message Yes  Some recent data might be hidden     Patient questions:  Do you have a fever, pain , or abdominal swelling? No. Pain Score  0 *  Have you tolerated food without any problems? Yes.    Have you been able to return to your normal activities? Yes.    Do you have any questions about your discharge instructions: Diet   No. Medications  No. Follow up visit  No.  Do you have questions or concerns about your Care? Yes.    Actions: * If pain score is 4 or above: No action needed, pain <4.  1. Have you developed a fever since your procedure? no  2.   Have you had an respiratory symptoms (SOB or cough) since your procedure? no  3.   Have you tested positive for COVID 19 since your procedure no  4.   Have you had any family members/close contacts diagnosed with the COVID 19 since your procedure?  no   If yes to any of these questions please route to Joylene John, RN and Alphonsa Gin, Therapist, sports.

## 2019-03-26 ENCOUNTER — Encounter: Payer: Self-pay | Admitting: Internal Medicine

## 2019-03-26 NOTE — Progress Notes (Signed)
6 diminutive adenomas Repeat colonoscopy 2023 My Chart letter

## 2019-05-13 ENCOUNTER — Other Ambulatory Visit: Payer: Self-pay

## 2019-05-13 ENCOUNTER — Other Ambulatory Visit: Payer: Self-pay | Admitting: Adult Health

## 2019-05-13 ENCOUNTER — Other Ambulatory Visit: Payer: PPO

## 2019-05-13 DIAGNOSIS — Z0000 Encounter for general adult medical examination without abnormal findings: Secondary | ICD-10-CM

## 2019-05-13 DIAGNOSIS — E663 Overweight: Secondary | ICD-10-CM

## 2019-05-13 DIAGNOSIS — E039 Hypothyroidism, unspecified: Secondary | ICD-10-CM

## 2019-05-13 DIAGNOSIS — E785 Hyperlipidemia, unspecified: Secondary | ICD-10-CM | POA: Diagnosis not present

## 2019-05-14 LAB — COMPREHENSIVE METABOLIC PANEL
ALT: 55 IU/L — ABNORMAL HIGH (ref 0–44)
AST: 45 IU/L — ABNORMAL HIGH (ref 0–40)
Albumin/Globulin Ratio: 1.8 (ref 1.2–2.2)
Albumin: 4.1 g/dL (ref 3.8–4.8)
Alkaline Phosphatase: 90 IU/L (ref 39–117)
BUN/Creatinine Ratio: 12 (ref 10–24)
BUN: 14 mg/dL (ref 8–27)
Bilirubin Total: 0.6 mg/dL (ref 0.0–1.2)
CO2: 25 mmol/L (ref 20–29)
Calcium: 9.6 mg/dL (ref 8.6–10.2)
Chloride: 107 mmol/L — ABNORMAL HIGH (ref 96–106)
Creatinine, Ser: 1.2 mg/dL (ref 0.76–1.27)
GFR calc Af Amer: 70 mL/min/{1.73_m2} (ref >59)
GFR calc non Af Amer: 61 mL/min/{1.73_m2} (ref >59)
Globulin, Total: 2.3 g/dL (ref 1.5–4.5)
Glucose: 97 mg/dL (ref 65–99)
Potassium: 4.5 mmol/L (ref 3.5–5.2)
Sodium: 144 mmol/L (ref 134–144)
Total Protein: 6.4 g/dL (ref 6.0–8.5)

## 2019-05-14 LAB — CBC WITH DIFFERENTIAL/PLATELET
Basophils Absolute: 0.1 10*3/uL (ref 0.0–0.2)
Basos: 1 %
EOS (ABSOLUTE): 0.2 10*3/uL (ref 0.0–0.4)
Eos: 3 %
Hematocrit: 47.3 % (ref 37.5–51.0)
Hemoglobin: 16.1 g/dL (ref 13.0–17.7)
Immature Grans (Abs): 0 10*3/uL (ref 0.0–0.1)
Immature Granulocytes: 0 %
Lymphocytes Absolute: 1.7 10*3/uL (ref 0.7–3.1)
Lymphs: 22 %
MCH: 32.5 pg (ref 26.6–33.0)
MCHC: 34 g/dL (ref 31.5–35.7)
MCV: 95 fL (ref 79–97)
Monocytes Absolute: 0.7 10*3/uL (ref 0.1–0.9)
Monocytes: 9 %
Neutrophils Absolute: 4.9 10*3/uL (ref 1.4–7.0)
Neutrophils: 65 %
Platelets: 272 10*3/uL (ref 150–450)
RBC: 4.96 x10E6/uL (ref 4.14–5.80)
RDW: 12.1 % (ref 11.6–15.4)
WBC: 7.5 10*3/uL (ref 3.4–10.8)

## 2019-05-14 LAB — LIPID PANEL
Chol/HDL Ratio: 2.2 ratio (ref 0.0–5.0)
Cholesterol, Total: 164 mg/dL (ref 100–199)
HDL: 74 mg/dL (ref >39)
LDL Chol Calc (NIH): 79 mg/dL (ref 0–99)
Triglycerides: 56 mg/dL (ref 0–149)
VLDL Cholesterol Cal: 11 mg/dL (ref 5–40)

## 2019-05-14 LAB — HEMOGLOBIN A1C
Est. average glucose Bld gHb Est-mCnc: 105 mg/dL
Hgb A1c MFr Bld: 5.3 % (ref 4.8–5.6)

## 2019-05-14 LAB — VITAMIN B12: Vitamin B-12: 537 pg/mL (ref 232–1245)

## 2019-05-14 LAB — TSH: TSH: 1.07 u[IU]/mL (ref 0.450–4.500)

## 2019-05-20 ENCOUNTER — Other Ambulatory Visit: Payer: Self-pay

## 2019-05-20 ENCOUNTER — Ambulatory Visit: Payer: PPO | Admitting: Adult Health

## 2019-05-20 ENCOUNTER — Encounter: Payer: Self-pay | Admitting: Adult Health

## 2019-05-20 VITALS — Ht 68.5 in | Wt 186.0 lb

## 2019-05-20 DIAGNOSIS — Z0000 Encounter for general adult medical examination without abnormal findings: Secondary | ICD-10-CM

## 2019-05-20 DIAGNOSIS — M542 Cervicalgia: Secondary | ICD-10-CM | POA: Insufficient documentation

## 2019-05-20 DIAGNOSIS — E039 Hypothyroidism, unspecified: Secondary | ICD-10-CM

## 2019-05-20 DIAGNOSIS — E785 Hyperlipidemia, unspecified: Secondary | ICD-10-CM

## 2019-05-20 DIAGNOSIS — R911 Solitary pulmonary nodule: Secondary | ICD-10-CM

## 2019-05-20 DIAGNOSIS — Z87891 Personal history of nicotine dependence: Secondary | ICD-10-CM

## 2019-05-20 MED ORDER — LEVOTHYROXINE SODIUM 112 MCG PO TABS: 112.0000 ug | ORAL_TABLET | Freq: Every day | ORAL | 0 refills | Status: DC

## 2019-05-20 NOTE — Assessment & Plan Note (Signed)
Levothyroxine refilled Reviewed recent labs Encouraged to reduce daily ETOH use- slight elevation in LFTs LDCT ordered PT referral placed Increase daily water intake F/u with Dr. Nahser/cards annually F/u with primary care 6 months Continue to social distance and wear a mask when in public  I have personally reviewed and noted the following in the patient's chart:   . Medical and social history . Use of alcohol, tobacco or illicit drugs  . Current medications and supplements . Functional ability and status . Nutritional status . Physical activity . Advanced directives . List of other physicians . Hospitalizations, surgeries, and ER visits in previous 12 months . Vitals . Screenings to include cognitive, depression, and falls . Referrals and appointments

## 2019-05-20 NOTE — Assessment & Plan Note (Signed)
Referral to PT placed

## 2019-05-20 NOTE — Assessment & Plan Note (Signed)
TSH-1.070 Levothyroxine 166mcg QD refilled

## 2019-05-20 NOTE — Assessment & Plan Note (Signed)
Rosuvastatin 10mg  QD Advised to f/u with Dr. Nahser/Cards annually

## 2019-05-20 NOTE — Progress Notes (Signed)
Subjective:   Joshua Moreno is a 69 y.o. male who presents for Medicare Annual/Subsequent preventive examination.  Review of Systems: General:   Denies fever, chills, unexplained weight loss.  Optho/Auditory:   Denies visual changes, blurred vision/LOV Respiratory:   Denies SOB, DOE more than baseline levels.  Cardiovascular:   Denies chest pain, palpitations, new onset peripheral edema  Gastrointestinal:   Denies nausea, vomiting, diarrhea.  Genitourinary: Denies dysuria, freq/ urgency, flank pain or discharge from genitals.  Endocrine:     Denies hot or cold intolerance, polyuria, polydipsia. Musculoskeletal:   Denies joint swelling, gait problems.  Neck pain + Skin:  Denies rash, suspicious lesions Neurological:     Denies dizziness, unexplained weakness, numbness  Psychiatric/Behavioral:   Denies mood changes, suicidal or homicidal ideations, hallucinations     Objective:    Vitals: Ht 5' 8.5" (1.74 m)   Wt 186 lb (84.4 kg)   BMI 27.87 kg/m   Body mass index is 27.87 kg/m.  Advanced Directives 03/13/2018 02/13/2017 01/11/2016 03/29/2015 01/06/2015  Does Patient Have a Medical Advance Directive? Yes Yes Yes Yes Yes  Type of Advance Directive - [place];Living will [place];Living will [place];Living will -  Does patient want to make changes to medical advance directive? No - Patient declined - - No - Patient declined -  Copy of [place] in Chart? - No - copy requested Yes No - copy requested No - copy requested    Tobacco Social History   Tobacco Use  Smoking Status Former Smoker  . Packs/day: 1.00  . Years: 35.00  . Pack years: 35.00  . Types: Cigarettes  . Quit date: 12/07/2003  . Years since quitting: 15.4  Smokeless Tobacco Never Used     Counseling given: Not Answered    Past Medical History:  Diagnosis Date  . Hyperlipidemia   . Personal history of colonic polyps-adenoma  06/26/2008  . Skin cancer of face    Actinic keratoses; followed annually by [facility]  . Thyroid disease    Past Surgical History:  Procedure Laterality Date  . COLONOSCOPY    . HYDROCELE EXCISION / REPAIR     R scrotum  . POLYPECTOMY    . tail bone     Family History  Problem Relation Age of Onset  . Heart disease Father 51       CABG/CAD  . Alzheimer's disease Mother   . Colon cancer Neg Hx   . Esophageal cancer Neg Hx   . Rectal cancer Neg Hx   . Stomach cancer Neg Hx   . Colon polyps Neg Hx    Social History   Socioeconomic History  . Marital status: Widowed    Spouse name: Not on file  . Number of children: Not on file  . Years of education: Not on file  . Highest education level: Not on file  Occupational History  . Occupation: retired  Tobacco Use  . Smoking status: Former Smoker    Packs/day: 1.00    Years: 35.00    Pack years: 35.00    Types: Cigarettes    Quit date: 12/07/2003    Years since quitting: 15.4  . Smokeless tobacco: Never Used  Substance and Sexual Activity  . Alcohol use: Yes    Alcohol/week: 16.0 standard drinks    Types: 4 Cans of beer, 6 Shots of liquor, 6 Standard drinks or equivalent per week  . Drug use:  No  . Sexual activity: Yes    Birth control/protection: None  Other Topics Concern  . 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      Tobacco: quit in 2000.  1 ppd x 30 years.      Alcohol:  Beer or bourbon daily; 5-8 drinks per week.        Exercise:  Plays tennis four days weekly. [employer].      ADLs: independent. Drives.      Advanced Directives: FULL CODE; no prolonged resuscitation.        Seatbelt: 100%; no texting while driving.           Social Determinants of Health   Financial Resource  Strain:   . Difficulty of Paying Living Expenses: Not on file  Food Insecurity:   . Worried About Charity fundraiser in the Last Year: Not on file  . Ran Out of Food in the Last Year: Not on file  Transportation Needs:   . Lack of Transportation (Medical): Not on file  . Lack of Transportation (Non-Medical): Not on file  Physical Activity:   . Days of Exercise per Week: Not on file  . Minutes of Exercise per Session: Not on file  Stress:   . Feeling of Stress : Not on file  Social Connections:   . Frequency of Communication with Friends and Family: Not on file  . Frequency of Social Gatherings with Friends and Family: Not on file  . Attends Religious Services: Not on file  . Active Member of Clubs or Organizations: Not on file  . Attends Archivist Meetings: Not on file  . Marital Status: Not on file    Outpatient Encounter Medications as of 05/20/2019  Medication Sig  . Ascorbic Acid (VITAMIN C) 100 MG tablet Take 200 mg by mouth daily.  Marland Kitchen aspirin EC 81 MG tablet Take 81 mg by mouth every other day.  . levothyroxine (SYNTHROID) 112 MCG tablet Take 1 tablet (112 mcg total) by mouth daily.  Marland Kitchen pyridOXINE (VITAMIN B-6) 100 MG tablet Take 100 mg by mouth daily.  . rosuvastatin (CRESTOR) 10 MG tablet Take 1 tablet (10 mg total) by mouth daily.  . [DISCONTINUED] levothyroxine (SYNTHROID) 112 MCG tablet Take 1 tablet (112 mcg total) by mouth daily.   No facility-administered encounter medications on file as of 05/20/2019.    Activities of Daily Living In your present state of health, do you have any difficulty performing the following activities: 05/20/2019  Hearing? N  Vision? N  Difficulty concentrating or making decisions? N  Walking or climbing stairs? N  Dressing or bathing? N  Doing errands, shopping? N  Some recent data might be hidden    Patient Care Team: Esaw Grandchild, NP as PCP - General (Family Medicine) Nahser, Wonda Cheng, MD as PCP - Cardiology  (Cardiology) [place], [place], [place] (Optometry) Harriett Sine, MD as Consulting Physician (Dermatology)   Assessment:   This is a routine wellness examination for Joshua Moreno.  Exercise Activities and Dietary recommendations Competitive tennis 6 days/week- denies dyspnea Estimates to drink 16-20 oz water/day- encouraged to increase water- strive to drink at least half of your body weight in oz  per day.  Intermittent neck pain- 2/10- 10/10 neck pain- occurring since August. He denies acute trauma/injury prior to onset of pain. Briefly tried OTC NSAIDs without much sx relief. Agreeable to PT referral   Goals    . Weight (lb) < 180 lb (81.6 kg)     Patient states that he wants to try to lose 5 lbs and start increasing his water intake.        Fall Risk Fall Risk  05/20/2019 04/17/2018 02/21/2017 02/13/2017 01/11/2016  Falls in the past year? 0 0 No Yes Yes  Number falls in past yr: - - - 1 1  Injury with Fall? - - - Yes No  Comment - - - concussion  -  Follow up Falls evaluation completed - - Falls prevention discussed -   Is the patient's home free of loose throw rugs in walkways, pet beds, electrical cords, etc?   no      Grab bars in the bathroom? no      Handrails on the stairs?   yes      Adequate lighting?   yes    Depression Screen PHQ 2/9 Scores 05/20/2019 04/17/2018 03/13/2018 02/21/2017  PHQ - 2 Score 0 0 0 0  PHQ- 9 Score 1 0 0 -    Cognitive Function-WNL     6CIT Screen 05/20/2019 02/13/2017  What Year? 0 points 0 points  What month? 0 points 0 points  What time? 0 points 0 points  Count back from 20 0 points 0 points  Months in reverse 0 points 0 points  Repeat phrase 0 points 2 points  Total Score 0 2    Immunization History  Administered Date(s) Administered  . Influenza Split 05/26/2009  . Influenza, High Dose Seasonal PF 04/17/2018  . Influenza,inj,Quad PF,6+ Mos 01/11/2016, 02/13/2017  . Influenza-Unspecified 02/14/2019  . Pneumococcal Conjugate-13  01/06/2015  . Pneumococcal Polysaccharide-23 05/26/2009, 02/13/2017  . Tdap 11/30/2011, 05/14/2018  . Zoster 05/08/2009    Qualifies for Shingles Vaccine? yes  Screening Tests Health Maintenance  Topic Date Due  . COLONOSCOPY  03/16/2022  . TETANUS/TDAP  05/14/2028  . INFLUENZA VACCINE  Completed  . Hepatitis C Screening  Completed  . PNA vac Low Risk Adult  Completed   Cancer Screenings: Lung: Low Dose CT Chest recommended if Age 65-80 years, 30 pack-year currently smoking OR have quit w/in 15years. Patient does qualify- Ordered  Colorectal: Last colonoscopy 03/17/2019, repeat in 2023  Additional Screenings:  Hepatitis C Screening:  Negative 01/06/2015      Plan:  Levothyroxine refilled Reviewed recent labs Encouraged to reduce daily ETOH use- slight elevation in LFTs LDCT ordered PT referral placed Increase daily water intake F/u with Dr. Nahser/cards annually F/u with primary care 6 months Continue to social distance and wear a mask when in public  I have personally reviewed and noted the following in the patient's chart:   . Medical and social history . Use of alcohol, tobacco or illicit drugs  . Current medications and supplements . Functional ability and status . Nutritional status . Physical activity . Advanced directives . List of other physicians . Hospitalizations, surgeries, and ER visits in previous 12 months . Vitals . Screenings to include cognitive, depression, and falls . Referrals and appointments  In addition, I have reviewed and discussed with patient certain preventive protocols, quality metrics, and best practice recommendations. A written personalized care plan for preventive services as well as general preventive health recommendations were provided to patient.  Esaw Grandchild, NP  05/20/2019

## 2019-06-03 ENCOUNTER — Encounter: Payer: Self-pay | Admitting: Adult Health

## 2019-06-03 ENCOUNTER — Encounter: Payer: Self-pay | Admitting: Radiology

## 2019-06-04 ENCOUNTER — Encounter: Payer: Self-pay | Admitting: Physical Therapy

## 2019-06-04 ENCOUNTER — Other Ambulatory Visit: Payer: Self-pay

## 2019-06-04 ENCOUNTER — Ambulatory Visit: Payer: PPO | Attending: Adult Health | Admitting: Physical Therapy

## 2019-06-04 DIAGNOSIS — H35371 Puckering of macula, right eye: Secondary | ICD-10-CM | POA: Diagnosis not present

## 2019-06-04 DIAGNOSIS — M6281 Muscle weakness (generalized): Secondary | ICD-10-CM

## 2019-06-04 DIAGNOSIS — R293 Abnormal posture: Secondary | ICD-10-CM | POA: Diagnosis not present

## 2019-06-04 DIAGNOSIS — M542 Cervicalgia: Secondary | ICD-10-CM | POA: Diagnosis not present

## 2019-06-04 NOTE — Patient Instructions (Signed)
Access Code: [email or web address]  URL: [email or web address]  Date: 06/04/2019  Prepared by: Hilda Blades   Exercises Seated Cervical Retraction Protraction AROM - 10 reps - 2 seconds hold - 2-3x daily - 7x weekly Sidelying Mid Thoracic Rotation - 10 reps - 2 seconds hold - 2-3x daily - 7x weekly Standing Row with Anchored Resistance - 20 reps - 2 sets - 2-3x daily - 7x weekly

## 2019-06-05 ENCOUNTER — Telehealth: Payer: Self-pay | Admitting: Adult Health

## 2019-06-05 NOTE — Therapy (Signed)
[place], [place], [address] Phone: [phone]   Fax:  [phone]  Physical Therapy Evaluation  Patient Details  Name: Joshua Moreno MRN: [id number] Date of Birth: 03/12/1951 Referring Provider (PT): Esaw Grandchild, NP   Encounter Date: 06/04/2019  PT End of Session - 06/04/19 1457    Visit Number  1    Number of Visits  8    Date for PT Re-Evaluation  07/30/19    Authorization Type  [employer]    PT Start Time  [id number]    PT Stop Time  1530    PT Time Calculation (min)  45 min    Activity Tolerance  Patient tolerated treatment well    Behavior During Therapy  [facility] for tasks assessed/performed       Past Medical History:  Diagnosis Date  . Hyperlipidemia   . Personal history of colonic polyps-adenoma 06/26/2008  . Skin cancer of face    Actinic keratoses; followed annually by [facility] D[facility]at[facility]  . Thyroid disease     Past Surgical History:  Procedure Laterality Date  . COLONOSCOPY    . HYDROCELE EXCISION / REPAIR     R scrotum  . POLYPECTOMY    . tail bone      There were no vitals filed for this visit.   Subjective Assessment - 06/04/19 1447    Subjective  Patient reports he is experiencing a really stiff neck. He notes that when he switched beds back in 12/2018 he noticed increased tightness and it hasn't really gone away since that time. He states about 10 days ago he had a healing team pray over him at church and this helped his neck about 60%, and he also notes an occasional sore throat that he believes that is related to his neck. He denies pain in the shoulder or down the arms. He plays tennis sometimes 6x/week.    Limitations  Sitting;House hold activities    How long can you sit comfortably?  45 minutes    How long can you stand comfortably?  No limitation    How long can you walk comfortably?  No limitation    Patient Stated Goals  Improve neck motion and  stiffness    Currently in Pain?  Yes    Pain Score  1    4-5/10 at worst   Pain Location  Neck    Pain Orientation  Right;Left    Pain Descriptors / Indicators  Tightness;Sharp    Pain Type  Chronic pain    Pain Onset  More than a month ago    Pain Frequency  Intermittent    Aggravating Factors   Moving the neck, stiffness when waking up in the morning    Pain Relieving Factors  Rest    Effect of Pain on Daily Activities  Patient reports he is mainly limited with driving when he has to rotate to look behind         [facility] PT Assessment - 06/05/19 0001      Assessment   Medical Diagnosis  Neck pain    Referring Provider (PT)  Esaw Grandchild, NP    Onset Date/Surgical Date  --   12/2018   Hand Dominance  Right    Next MD Visit  Not scheduled    Prior Therapy  No      Precautions   Precautions  None      Restrictions   Weight Bearing  Restrictions  No      Balance Screen   Has the patient fallen in the past 6 months  No    Has the patient had a decrease in activity level because of a fear of falling?   No    Is the patient reluctant to leave their home because of a fear of falling?   No      Home Film/video editor residence    Living Arrangements  Alone      Prior Function   Level of Independence  Independent    Leisure  Tennis      Cognition   Overall Cognitive Status  Within Functional Limits for tasks assessed      Observation/Other Assessments   Observations  Patient appears in no apparent distress    Focus on Therapeutic Outcomes (FOTO)   30% limitation      Posture/Postural Control   Posture Comments  Patient exhibits forward head and rounded shoulder posture with increased thoracic kyphosis      ROM / Strength   AROM / PROM / Strength  AROM;Strength      AROM   Overall AROM Comments  Patient reports increased discomfort at end range of all motion, greater with rotational movements    AROM Assessment Site  Cervical    Cervical  Flexion  30    Cervical Extension  35    Cervical - Right Side Bend  20    Cervical - Left Side Bend  15    Cervical - Right Rotation  35    Cervical - Left Rotation  40      Strength   Overall Strength Comments  Periscapular strength grossly 4-/5 bilaterally    Strength Assessment Site  Shoulder    Right/Left Shoulder  Right;Left    Right Shoulder Flexion  5/5    Right Shoulder ABduction  5/5    Right Shoulder External Rotation  5/5    Left Shoulder Flexion  5/5    Left Shoulder ABduction  5/5    Left Shoulder External Rotation  5/5      Palpation   Palpation comment  Minimal TTP over cervical paraspinals, upper trap, levator,       Special Tests   Other special tests  All radicular testing negative      Transfers   Transfers  Independent with all Transfers                Objective measurements completed on examination: See above findings.      [facility] Adult PT Treatment/Exercise - 06/05/19 0001      Exercises   Exercises  Neck      Neck Exercises: Theraband   Rows  20 reps;Red    Rows Limitations  VC to avoid shrug      Neck Exercises: Seated   Neck Retraction  10 reps;3 secs    Neck Retraction Limitations  max VC for technique, protraction/retraction      Neck Exercises: Sidelying   Other Sidelying Exercise  Thoracic rotation open book x10 - VC for technique             PT Education - 06/04/19 1456    Education Details  Exam findings, POC, HEP    Person(s) Educated  Patient    Methods  Explanation;Demonstration;Tactile cues;Verbal cues;Handout    Comprehension  Verbalized understanding;Returned demonstration;Verbal cues required;Tactile cues required;Need further instruction       PT Short Term Goals -  06/04/19 1458      PT SHORT TERM GOAL #1   Title  Patient will be I with initial HEP to progress with PT    Time  4    Period  Weeks    Status  New    Target Date  07/01/19      PT SHORT TERM GOAL #2   Title  Patient will exhibit >5  deg improvement in cervical rotation bilaterally to improve driving ability    Time  4    Period  Weeks    Status  New    Target Date  07/01/19        PT Long Term Goals - 06/05/19 0818      PT LONG TERM GOAL #1   Title  Patient will be independent with final advanced HEP to maintain progress in PT.    Time  8    Period  Weeks    Status  New    Target Date  07/30/19      PT LONG TERM GOAL #2   Title  Patient will exhibit improved periscapular strength to >/= 4+/5 MMT bilat and DNF endurance > 20 sec to improve ability to sit for extended periods with reduced pain.    Time  8    Period  Weeks    Status  New    Target Date  07/30/19      PT LONG TERM GOAL #3   Title  Patient will exhibit improved cervical rotation to >/= 45 deg bilat to improve driving ability and turning head while playing tennis    Time  8    Period  Weeks    Status  New    Target Date  07/30/19      PT LONG TERM GOAL #4   Title  Patient will report </= 1-2/10 pain at end range to allow for improved activity tolerance and ability to turn head quickly    Time  8    Period  Weeks    Status  New    Target Date  07/30/19             Plan - 06/04/19 1459    Clinical Impression Statement  Patient presents to PT with report of neck stiffness and pain at end ranges of motion. He exhibits limited mobility of the cervical and thoracic spine with decreased active motion in all directions. He does not exhibit any radicular symptoms and his pain is most likely facet related. He would benefit from continued skilled PT to progress his range of motion and improve ability to perform activitis involving cervical motion such as driving.    Personal Factors and Comorbidities  Age;Time since onset of injury/illness/exacerbation    Examination-Activity Limitations  Sit    Examination-Participation Restrictions  Driving    Stability/Clinical Decision Making  Stable/Uncomplicated    Clinical Decision Making  Low     Rehab Potential  Good    PT Duration  8 weeks    PT Treatment/Interventions  ADLs/Self Care Home Management;Cryotherapy;Electrical Stimulation;Moist Heat;Neuromuscular re-education;Therapeutic exercise;Therapeutic activities;Patient/family education;Manual techniques;Dry needling;Passive range of motion;Taping;Spinal Manipulations;Joint Manipulations    PT Next Visit Plan  Assess HEP and progress PRN, manual for cervical and thoracic mobility, exercise for cervical and thoracic mobility, periscapular strengthening for postural control    PT Home Exercise Plan  Seated cervical protraction/retraction, side lying thoracic rotation open book, banded row with red    Consulted and Agree with Plan of Care  Patient  Patient will benefit from skilled therapeutic intervention in order to improve the following deficits and impairments:  Postural dysfunction, Pain, Decreased strength, Decreased range of motion, Impaired flexibility, Decreased endurance, Hypomobility  Visit Diagnosis: Cervicalgia  Muscle weakness (generalized)  Abnormal posture     Problem List Patient Active Problem List   Diagnosis Date Noted  . Neck pain 05/20/2019  . Murmur, cardiac 04/17/2018  . Healthcare maintenance 03/13/2018  . Hyperlipidemia 12/04/2011  . Hypothyroidism 12/04/2011  . RBBB 12/04/2011  . Overweight (BMI 25.0-29.9) 12/04/2011  . History of tobacco use 12/04/2011  . History of colonic polyps 06/26/2008    Hilda Blades, PT, DPT, LAT, ATC 06/05/19  8:25 AM Phone: [phone] Fax: [place] [facility] [address] [place], [place], [address] Phone: [phone]   Fax:  [phone]  Name: Joshua Moreno MRN: [id number] Date of Birth: 1950/10/03

## 2019-06-05 NOTE — Telephone Encounter (Signed)
06/05/2019  LVM for pt to call to discuss.  Charyl Bigger, CMA

## 2019-06-05 NOTE — Telephone Encounter (Signed)
Patient came by office was wondering Why CT Scan) was cancelled--advised no reason listed in his chart but would forward message to med asst/ provider to contact him.  --Forwarding pt's inquiry for review.  --glh

## 2019-06-06 ENCOUNTER — Inpatient Hospital Stay: Admission: RE | Admit: 2019-06-06 | Payer: PPO | Source: Ambulatory Visit

## 2019-06-09 ENCOUNTER — Other Ambulatory Visit: Payer: Self-pay

## 2019-06-09 ENCOUNTER — Encounter: Payer: Self-pay | Admitting: Physical Therapy

## 2019-06-09 ENCOUNTER — Ambulatory Visit: Payer: PPO | Attending: Adult Health | Admitting: Physical Therapy

## 2019-06-09 DIAGNOSIS — M6281 Muscle weakness (generalized): Secondary | ICD-10-CM | POA: Insufficient documentation

## 2019-06-09 DIAGNOSIS — R293 Abnormal posture: Secondary | ICD-10-CM | POA: Diagnosis not present

## 2019-06-09 DIAGNOSIS — M542 Cervicalgia: Secondary | ICD-10-CM | POA: Diagnosis not present

## 2019-06-09 NOTE — Telephone Encounter (Signed)
06/09/2019  LDCT scan cancelled because pt does not meet Medicare criteria for coverage of this test.  Pt would like to know if he should keep his follow up with Dr. Acie Fredrickson since he cannot have this test done.  Please advise pt if f/u is needed.  Charyl Bigger, CMA

## 2019-06-09 NOTE — Therapy (Signed)
[place], [place], [address] Phone: [phone]   Fax:  [phone]  Physical Therapy Treatment  Patient Details  Name: Joshua Moreno MRN: [id number] Date of Birth: Apr 13, 1951 Referring Provider (PT): Esaw Grandchild, NP   Encounter Date: 06/09/2019  PT End of Session - 06/09/19 1422    Visit Number  2    Number of Visits  8    Date for PT Re-Evaluation  07/30/19    Authorization Type  [employer] PPO    PT Start Time  [id number]    PT Stop Time  1456    PT Time Calculation (min)  39 min    Activity Tolerance  Patient tolerated treatment well    Behavior During Therapy  [facility] for tasks assessed/performed       Past Medical History:  Diagnosis Date  . Hyperlipidemia   . Personal history of colonic polyps-adenoma 06/26/2008  . Skin cancer of face    Actinic keratoses; followed annually by [facility] D[facility]at[facility]  . Thyroid disease     Past Surgical History:  Procedure Laterality Date  . COLONOSCOPY    . HYDROCELE EXCISION / REPAIR     R scrotum  . POLYPECTOMY    . tail bone      There were no vitals filed for this visit.  Subjective Assessment - 06/09/19 1423    Subjective  " I am doing much better, I am about 90% better. I have been doing the exercises at home"    Patient Stated Goals  Improve neck motion and stiffness    Currently in Pain?  No/denies    Pain Score  2     Pain Location  Neck    Pain Orientation  Left;Right    Pain Descriptors / Indicators  Tightness    Pain Onset  More than a month ago    Pain Frequency  Intermittent         [facility] PT Assessment - 06/09/19 0001      Assessment   Medical Diagnosis  Neck pain    Referring Provider (PT)  Esaw Grandchild, NP                   [facility] Adult PT Treatment/Exercise - 06/09/19 0001      Neck Exercises: Seated   Other Seated Exercise  upper cervical rotation using 3 fingers as tactile cues for proper tuck  position      Neck Exercises: Supine   Cervical Isometrics  Extension;5 secs;10 reps   MAI with rotation to the L/R      Manual Therapy   Manual Therapy  Joint mobilization;Soft tissue mobilization;Myofascial release;Passive ROM    Manual therapy comments  MTPR along the upper trap/ levator scapuale bil    Joint Mobilization  C3-C6 lateral gapping mobs grade III    Soft tissue mobilization  sub-occipital release     Passive ROM  cervical rotation working with end range oscillations with grade I manual traction in flxion position      Prosthetics   Prosthetic Care Comments   4      Neck Exercises: Stretches   Upper Trapezius Stretch  2 reps;30 seconds             PT Education - 06/09/19 1541    Education Details  updated HEP for upper cervical ROM    Person(s) Educated  Patient    Methods  Explanation;Verbal cues;Handout    Comprehension  Verbalized understanding;Verbal cues required       PT Short Term Goals - 06/04/19 1458      PT SHORT TERM GOAL #1   Title  Patient will be I with initial HEP to progress with PT    Time  4    Period  Weeks    Status  New    Target Date  07/01/19      PT SHORT TERM GOAL #2   Title  Patient will exhibit >5 deg improvement in cervical rotation bilaterally to improve driving ability    Time  4    Period  Weeks    Status  New    Target Date  07/01/19        PT Long Term Goals - 06/05/19 0818      PT LONG TERM GOAL #1   Title  Patient will be independent with final advanced HEP to maintain progress in PT.    Time  8    Period  Weeks    Status  New    Target Date  07/30/19      PT LONG TERM GOAL #2   Title  Patient will exhibit improved periscapular strength to >/= 4+/5 MMT bilat and DNF endurance > 20 sec to improve ability to sit for extended periods with reduced pain.    Time  8    Period  Weeks    Status  New    Target Date  07/30/19      PT LONG TERM GOAL #3   Title  Patient will exhibit improved cervical rotation  to >/= 45 deg bilat to improve driving ability and turning head while playing tennis    Time  8    Period  Weeks    Status  New    Target Date  07/30/19      PT LONG TERM GOAL #4   Title  Patient will report </= 1-2/10 pain at end range to allow for improved activity tolerance and ability to turn head quickly    Time  8    Period  Weeks    Status  New    Target Date  07/30/19            Plan - 06/09/19 1521    Clinical Impression Statement  pt reported consistency with his HEP and notes he is feeling significantly better at ~ 70%. focused on manual techniques to promote cervical mobility with flexion to promte emphasis for facet opening, Utilized MTPR focusing on sub-occipitals and bil upper trap.end of session he felt he is ~15% better.    PT Treatment/Interventions  ADLs/Self Care Home Management;Cryotherapy;Electrical Stimulation;Moist Heat;Neuromuscular re-education;Therapeutic exercise;Therapeutic activities;Patient/family education;Manual techniques;Dry needling;Passive range of motion;Taping;Spinal Manipulations;Joint Manipulations    PT Next Visit Plan  Assess HEP and progress PRN, manual for cervical and thoracic mobility, exercise for cervical and thoracic mobility, periscapular strengthening for postural control    PT Home Exercise Plan  Seated cervical protraction/retraction, side lying thoracic rotation open book, banded row with red, upper cervical    Consulted and Agree with Plan of Care  Patient       Patient will benefit from skilled therapeutic intervention in order to improve the following deficits and impairments:  Postural dysfunction, Pain, Decreased strength, Decreased range of motion, Impaired flexibility, Decreased endurance, Hypomobility  Visit Diagnosis: Cervicalgia  Muscle weakness (generalized)  Abnormal posture     Problem List Patient Active Problem List   Diagnosis Date Noted  . Neck pain  05/20/2019  . Murmur, cardiac 04/17/2018  .  Healthcare maintenance 03/13/2018  . Hyperlipidemia 12/04/2011  . Hypothyroidism 12/04/2011  . RBBB 12/04/2011  . Overweight (BMI 25.0-29.9) 12/04/2011  . History of tobacco use 12/04/2011  . History of colonic polyps 06/26/2008   Starr Lake PT, DPT, LAT, ATC  06/09/19  3:45 PM      [facility] [facility] [address] [place], [place], [address] Phone: [phone]   Fax:  [phone]  Name: MARSALIS GIULIANI MRN: [id number] Date of Birth: Aug 01, 1950

## 2019-06-12 NOTE — Telephone Encounter (Signed)
Per Dr. Acie Fredrickson, he will need to keep his appointment with cardiology. The Ct for lung cancer screening is a separate issue.

## 2019-06-12 NOTE — Telephone Encounter (Signed)
06/12/2019  Pt advised to keep appt with cardiology per Dr. Elmarie Shiley office.  Charyl Bigger, CMA

## 2019-06-18 ENCOUNTER — Other Ambulatory Visit: Payer: Self-pay

## 2019-06-18 ENCOUNTER — Ambulatory Visit: Payer: PPO | Admitting: Physical Therapy

## 2019-06-18 ENCOUNTER — Encounter: Payer: Self-pay | Admitting: Physical Therapy

## 2019-06-18 DIAGNOSIS — M542 Cervicalgia: Secondary | ICD-10-CM | POA: Diagnosis not present

## 2019-06-18 DIAGNOSIS — R293 Abnormal posture: Secondary | ICD-10-CM

## 2019-06-18 DIAGNOSIS — M6281 Muscle weakness (generalized): Secondary | ICD-10-CM

## 2019-06-18 NOTE — Therapy (Signed)
[place], [place], [address] Phone: [phone]   Fax:  [phone]  Physical Therapy Discharge  PHYSICAL THERAPY DISCHARGE SUMMARY  Visits from Start of Care: 3  Current functional level related to goals / functional outcomes: Patient denies any functional deficits    Remaining deficits: Periscapular strength, DNF endurance, cervical ROM   Education / Equipment: HEP  Plan: Patient agrees to discharge.  Patient goals were partially met. Patient is being discharged due to being pleased with the current functional level.  ?????      Patient Details  Name: Joshua Moreno MRN: [id number] Date of Birth: 02/18/1951 Referring Provider (PT): Esaw Grandchild, NP   Encounter Date: 06/18/2019  PT End of Session - 06/18/19 1451    Visit Number  3    Number of Visits  8    Date for PT Re-Evaluation  07/30/19    Authorization Type  [employer] PPO    PT Start Time  1771    PT Stop Time  1515    PT Time Calculation (min)  30 min    Activity Tolerance  Patient tolerated treatment well    Behavior During Therapy  [facility] for tasks assessed/performed       Past Medical History:  Diagnosis Date  . Hyperlipidemia   . Personal history of colonic polyps-adenoma 06/26/2008  . Skin cancer of face    Actinic keratoses; followed annually by [facility]  . Thyroid disease     Past Surgical History:  Procedure Laterality Date  . COLONOSCOPY    . HYDROCELE EXCISION / REPAIR     R scrotum  . POLYPECTOMY    . tail bone      There were no vitals filed for this visit.  Subjective Assessment - 06/18/19 1449    Subjective  Patient reports he pain is 98% gone. The only time he has trouble is when he twists hard one way of the other.    Currently in Pain?  No/denies         [facility] PT Assessment - 06/18/19 0001      Assessment   Medical Diagnosis  Neck pain    Referring Provider (PT)   Danford, Berna Spare, NP      Balance Screen   Has the patient fallen in the past 6 months  No      Prior Function   Level of Independence  Independent      Observation/Other Assessments   Focus on Therapeutic Outcomes (FOTO)   13% limitation      Functional Tests   Functional tests  Other      Other:   Other/ Comments  DNF Endurance: 12 sec      AROM   Cervical Flexion  35    Cervical Extension  45    Cervical - Right Side Bend  25    Cervical - Left Side Bend  25    Cervical - Right Rotation  50    Cervical - Left Rotation  54      Strength   Overall Strength Comments  Periscapular strength grossly 4/5 bilaterally      Palpation   Palpation comment  No TTP                   [facility] Adult PT Treatment/Exercise - 06/18/19 0001      Self-Care   Self-Care  Other Self-Care Comments    Other Self-Care Comments  HEP consistency, progressions, expectations following discharge      Exercises   Exercises  Neck      Neck Exercises: Theraband   Rows  20 reps;Red      Neck Exercises: Seated   Neck Retraction  10 reps;3 secs    Neck Retraction Limitations  protraction/retraction    Other Seated Exercise  Upper cervical rotation using 3 fingers as tactile cues for proper tuck position x10 each      Manual Therapy   Manual Therapy  Joint mobilization;Soft tissue mobilization;Manual Traction    Joint Mobilization  C3-C6 lateral gapping mobs grade III    Soft tissue mobilization  Suboccipital release    Passive ROM  Upper cervical rotation with end range occilations    Manual Traction  Subocciptal release with manual traction      Neck Exercises: Stretches   Upper Trapezius Stretch  2 reps;30 seconds             PT Education - 06/18/19 1450    Education Details  HEP    Person(s) Educated  Patient    Methods  Explanation;Demonstration;Verbal cues;Tactile cues    Comprehension  Verbalized understanding;Returned demonstration;Verbal cues required;Tactile cues  required;Need further instruction       PT Short Term Goals - 06/18/19 1506      PT SHORT TERM GOAL #1   Title  Patient will be I with initial HEP to progress with PT    Time  4    Period  Weeks    Status  Achieved    Target Date  07/01/19      PT SHORT TERM GOAL #2   Title  Patient will exhibit >5 deg improvement in cervical rotation bilaterally to improve driving ability    Time  4    Period  Weeks    Status  Achieved    Target Date  07/01/19        PT Long Term Goals - 06/18/19 1519      PT LONG TERM GOAL #1   Title  Patient will be independent with final advanced HEP to maintain progress in PT.    Time  8    Period  Weeks    Status  Achieved      PT LONG TERM GOAL #2   Title  Patient will exhibit improved periscapular strength to >/= 4+/5 MMT bilat and DNF endurance > 20 sec to improve ability to sit for extended periods with reduced pain.    Time  8    Period  Weeks    Status  Not Met      PT LONG TERM GOAL #3   Title  Patient will exhibit improved cervical rotation to >/= 45 deg bilat to improve driving ability and turning head while playing tennis    Time  8    Period  Weeks    Status  Achieved      PT LONG TERM GOAL #4   Title  Patient will report </= 1-2/10 pain at end range to allow for improved activity tolerance and ability to turn head quickly    Time  8    Period  Weeks    Status  Achieved            Plan - 06/18/19 1452    Clinical Impression Statement  Patient exhibits great improvement in his symptoms and range of motion and he feels he is ready to be discharged to perform exercises on his own.  He has not met all his goals but he has exhibits significant progress with his range of motion and improvement in pain level and function. He will be discharged as formal PT is no longer indicated.    PT Treatment/Interventions  ADLs/Self Care Home Management;Cryotherapy;Electrical Stimulation;Moist Heat;Neuromuscular re-education;Therapeutic  exercise;Therapeutic activities;Patient/family education;Manual techniques;Dry needling;Passive range of motion;Taping;Spinal Manipulations;Joint Manipulations    PT Next Visit Plan  NA    PT Home Exercise Plan  Seated cervical protraction/retraction, side lying thoracic rotation open book, banded row with red, upper cervical rotation with neck flexion    Consulted and Agree with Plan of Care  Patient       Patient will benefit from skilled therapeutic intervention in order to improve the following deficits and impairments:     Visit Diagnosis: Cervicalgia  Muscle weakness (generalized)  Abnormal posture     Problem List Patient Active Problem List   Diagnosis Date Noted  . Neck pain 05/20/2019  . Murmur, cardiac 04/17/2018  . Healthcare maintenance 03/13/2018  . Hyperlipidemia 12/04/2011  . Hypothyroidism 12/04/2011  . RBBB 12/04/2011  . Overweight (BMI 25.0-29.9) 12/04/2011  . History of tobacco use 12/04/2011  . History of colonic polyps 06/26/2008    Hilda Blades, PT, DPT, LAT, ATC 06/18/19  3:29 PM Phone: [phone] Fax: [place] [facility] [address] [place], [place], [address] Phone: [phone]   Fax:  [phone]  Name: LAMIN CHANDLEY MRN: [id number] Date of Birth: [birth year]

## 2019-06-24 MED ORDER — ROSUVASTATIN CALCIUM 10 MG PO TABS: 10.0000 mg | ORAL_TABLET | Freq: Every day | ORAL | 0 refills | Status: DC

## 2019-06-25 ENCOUNTER — Ambulatory Visit: Payer: PPO | Admitting: Physical Therapy

## 2019-07-02 ENCOUNTER — Ambulatory Visit: Payer: PPO | Admitting: Physical Therapy

## 2019-07-09 NOTE — Progress Notes (Signed)
Cardiology Office Note:    Date:  07/09/2019   ID:  [name], DOB May 20, 1950, MRN [id number]  PCP:  Esaw Grandchild, NP  Cardiologist:  Mertie Moores, MD  Electrophysiologist:  None   Referring MD: Esaw Grandchild, NP   1.  Problem list 1.  Coronary artery calcifications 2.  Right bundle branch block 3.  Hyperlipidemia 4.  Hypothyroidism  No chief complaint on file.    Feb. 11, 2020    Joshua Moreno is a 69 y.o. male with a hx of hyperlipidemia and right bundle branch block.  He recently had a CT of the chest for cancer screening and was found to have coronary artery calcifications.   Denies any chest pain or shortness of breath.  He plays competitive tennis 4 times a week.  Can play for 2 hours a day in the summer .    Retired in the Control and instrumentation engineer business.    Lots of volunteer work at [employer] No syncope  Has been on atorvastatin for 10 years . Lipids look okay.  His LDL is 103.  Total cholesterol is 187.  HDL is 67.  Triglyceride level is 87.  July 10, 2019  Joshua Moreno is seen back today for follow-up of his hyperlipidemia and right bundle branch block. Wife passed away last summer.  No CP or dyspnea Still plays tennis 6 day a week .   Past Medical History:  Diagnosis Date  . Hyperlipidemia   . Personal history of colonic polyps-adenoma 06/26/2008  . Skin cancer of face    Actinic keratoses; followed annually by [facility]  . Thyroid disease     Past Surgical History:  Procedure Laterality Date  . COLONOSCOPY    . HYDROCELE EXCISION / REPAIR     R scrotum  . POLYPECTOMY    . tail bone      Current Medications: No outpatient medications have been marked as taking for the 07/10/19 encounter (Appointment) with Saniah Schroeter, Wonda Cheng, MD.     Allergies:   Cinnamon   Social History   Socioeconomic History  . Marital status: Widowed    Spouse name: Not on file  . Number of children: Not on file  . Years of education: Not on file  . Highest  education level: Not on file  Occupational History  . Occupation: retired  Tobacco Use  . Smoking status: Former Smoker    Packs/day: 1.00    Years: 35.00    Pack years: 35.00    Types: Cigarettes    Quit date: 12/07/2003    Years since quitting: 15.5  . Smokeless tobacco: Never Used  Substance and Sexual Activity  . Alcohol use: Yes    Alcohol/week: 16.0 standard drinks    Types: 4 Cans of beer, 6 Shots of liquor, 6 Standard drinks or equivalent per week  . Drug use: No  . Sexual activity: Yes    Birth control/protection: None  Other Topics Concern  . 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      Tobacco: quit in 2000.  1 ppd x 30 years.      Alcohol:  Beer or bourbon daily; 5-8 drinks per week.  Exercise:  Plays tennis four days weekly. [employer].      ADLs: independent. Drives.      Advanced Directives: FULL CODE; no prolonged resuscitation.        Seatbelt: 100%; no texting while driving.           Social Determinants of Health   Financial Resource Strain:   . Difficulty of Paying Living Expenses: Not on file  Food Insecurity:   . Worried About Charity fundraiser in the Last Year: Not on file  . Ran Out of Food in the Last Year: Not on file  Transportation Needs:   . Lack of Transportation (Medical): Not on file  . Lack of Transportation (Non-Medical): Not on file  Physical Activity:   . Days of Exercise per Week: Not on file  . Minutes of Exercise per Session: Not on file  Stress:   . Feeling of Stress : Not on file  Social Connections:   . Frequency of Communication with Friends and Family: Not on file  . Frequency of Social Gatherings with Friends and Family: Not on file  . Attends Religious Services: Not on file  . Active Member of Clubs  or Organizations: Not on file  . Attends Archivist Meetings: Not on file  . Marital Status: Not on file     Family History: The patient's family history includes Alzheimer's disease in his mother; Heart disease (age of onset: 58) in his father. There is no history of Colon cancer, Esophageal cancer, Rectal cancer, Stomach cancer, or Colon polyps.    ROS:   Please see the history of present illness.     All other systems reviewed and are negative.  EKGs/Labs/Other Studies Reviewed:    The following studies were reviewed today:   EKG:   June 18, 2018: Sinus bradycardia at 47 beats a minute.  He has a right bundle branch block.  PR interval is 166 ms.  Right bundle branch block is old.  Recent Labs: 05/13/2019: ALT 55; BUN 14; Creatinine, Ser 1.20; Hemoglobin 16.1; Platelets 272; Potassium 4.5; Sodium 144; TSH 1.070  Recent Lipid Panel    Component Value Date/Time   CHOL 164 05/13/2019 0825   TRIG 56 05/13/2019 0825   HDL 74 05/13/2019 0825   CHOLHDL 2.2 05/13/2019 0825   CHOLHDL 2.1 01/11/2016 0819   VLDL 17 01/11/2016 0819   LDLCALC 79 05/13/2019 0825    Physical Exam:    Physical Exam: There were no vitals taken for this visit.  GEN:  Well nourished, well developed in no acute distress HEENT: Normal NECK: No JVD; No carotid bruits LYMPHATICS: No lymphadenopathy CARDIAC: RRR , no murmurs, rubs, gallops RESPIRATORY:  Clear to auscultation without rales, wheezing or rhonchi  ABDOMEN: Soft, non-tender, non-distended MUSCULOSKELETAL:  No edema; No deformity  SKIN: Warm and dry NEUROLOGIC:  Alert and oriented x 3     ASSESSMENT:    No diagnosis found. PLAN:    In order of problems listed above:   Coronary Artery disease:  He has coronary artery calcifications Still very active No angina.  He plays tennis 6 days a week.  2.  Right bundle branch block: Stable No issues.  We will continue to follow.    Medication Adjustments/Labs and Tests  Ordered: Current medicines are reviewed at length with the patient today.  Concerns regarding medicines are outlined above.  No orders of the defined types were placed in this encounter.  No orders of the defined  types were placed in this encounter.  There are no Patient Instructions on file for this visit.   Signed, Mertie Moores, MD  07/09/2019 9:11 PM    [facility]

## 2019-07-10 ENCOUNTER — Other Ambulatory Visit: Payer: Self-pay

## 2019-07-10 ENCOUNTER — Encounter: Payer: Self-pay | Admitting: Cardiovascular Disease

## 2019-07-10 ENCOUNTER — Ambulatory Visit: Payer: PPO | Admitting: Cardiovascular Disease

## 2019-07-10 VITALS — BP 118/78 | HR 59 | Ht 68.5 in | Wt 193.4 lb

## 2019-07-10 DIAGNOSIS — E782 Mixed hyperlipidemia: Secondary | ICD-10-CM | POA: Diagnosis not present

## 2019-07-10 DIAGNOSIS — I4510 Unspecified right bundle-branch block: Secondary | ICD-10-CM | POA: Diagnosis not present

## 2019-07-10 MED ORDER — ROSUVASTATIN CALCIUM 10 MG PO TABS: 10.0000 mg | ORAL_TABLET | Freq: Every day | ORAL | 3 refills | Status: DC

## 2019-07-10 NOTE — Patient Instructions (Signed)
Medication Instructions:  Your physician recommends that you continue on your current medications as directed. Please refer to the Current Medication list given to you today.  A refill of your Rosuvastatin has been sent to your pharmacy   Lab Work: None Ordered If you have labs (blood work) drawn today and your tests are completely normal, you will receive your results only by: Marland Kitchen MyChart Message (if you have MyChart) OR . A paper copy in the mail If you have any lab test that is abnormal or we need to change your treatment, we will call you to review the results.   Testing/Procedures: None Ordered   Follow-Up: At [facility], you and your health needs are our priority.  As part of our continuing mission to provide you with exceptional heart care, we have created designated Provider Care Teams.  These Care Teams include your primary Cardiologist (physician) and Advanced Practice Providers (APPs -  Physician Assistants and Nurse Practitioners) who all work together to provide you with the care you need, when you need it.   Your next appointment:   1 year(s)  The format for your next appointment:   In Person  Provider:   You may see Mertie Moores, MD or one of the following Advanced Practice Providers on your designated Care Team:    Richardson Dopp, PA-C  [place], [place]  Daune Perch, [place]

## 2019-08-18 DIAGNOSIS — D225 Melanocytic nevi of trunk: Secondary | ICD-10-CM | POA: Diagnosis not present

## 2019-08-18 DIAGNOSIS — L821 Other seborrheic keratosis: Secondary | ICD-10-CM | POA: Diagnosis not present

## 2019-08-18 DIAGNOSIS — L814 Other melanin hyperpigmentation: Secondary | ICD-10-CM | POA: Diagnosis not present

## 2019-08-18 DIAGNOSIS — L570 Actinic keratosis: Secondary | ICD-10-CM | POA: Diagnosis not present

## 2019-08-18 DIAGNOSIS — D1801 Hemangioma of skin and subcutaneous tissue: Secondary | ICD-10-CM | POA: Diagnosis not present

## 2019-08-18 DIAGNOSIS — D485 Neoplasm of uncertain behavior of skin: Secondary | ICD-10-CM | POA: Diagnosis not present

## 2019-08-18 DIAGNOSIS — L820 Inflamed seborrheic keratosis: Secondary | ICD-10-CM | POA: Diagnosis not present

## 2019-08-18 DIAGNOSIS — Z85828 Personal history of other malignant neoplasm of skin: Secondary | ICD-10-CM | POA: Diagnosis not present

## 2019-09-03 DIAGNOSIS — H35371 Puckering of macula, right eye: Secondary | ICD-10-CM | POA: Diagnosis not present

## 2019-09-25 ENCOUNTER — Other Ambulatory Visit: Payer: Self-pay | Admitting: Adult Health

## 2019-10-02 ENCOUNTER — Other Ambulatory Visit: Payer: Self-pay | Admitting: Adult Health

## 2019-10-11 IMAGING — CT CT CHEST LUNG CANCER SCREENING LOW DOSE W/O CM
2 of 5 series · 15 of 40 positions shown, 18 images · non-contrast
Comparison: None

CLINICAL DATA: Lung cancer screening. Thirty-five pack-year
history. Asymptomatic current smoker.

EXAM:
CT CHEST WITHOUT CONTRAST LOW-DOSE FOR LUNG CANCER SCREENING
TECHNIQUE: Multidetector CT imaging of the chest was performed following the
standard protocol without IV contrast.

[Series 4: lung 1.00 br44 cor · coronal · 0.63mm/px · 3 of 315 slices shown]
[im 63/315  lung]
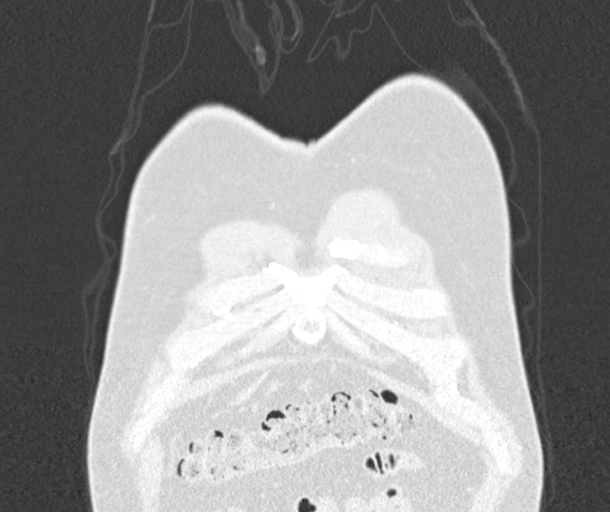
[im 126/315  lung]
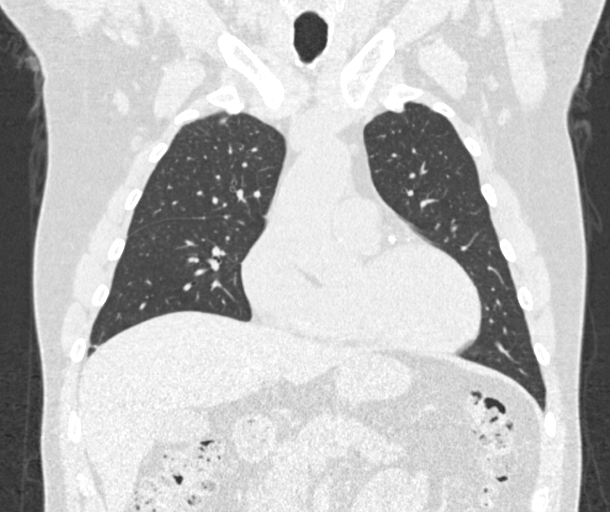
[im 189/315  lung]
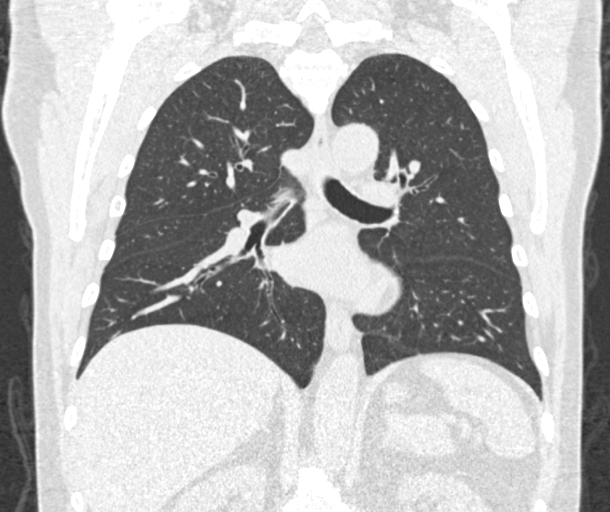

[Series 9: lung 1.00 br60 · axial · 0.75mm/px · z∈[-1103,-809]mm · 12 of 324 slices shown, 15 images]
[im 15/324  mediastinal]
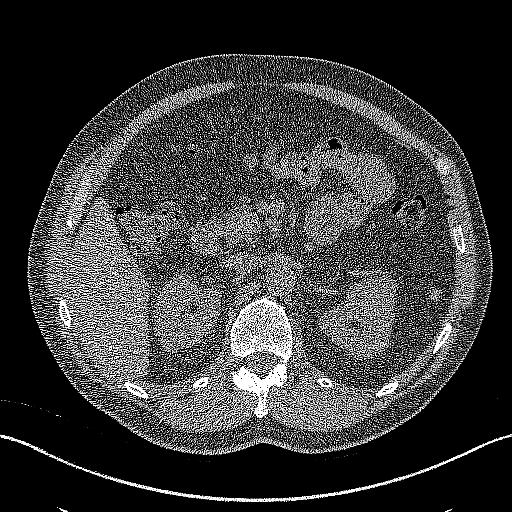
[im 15/324  lung]
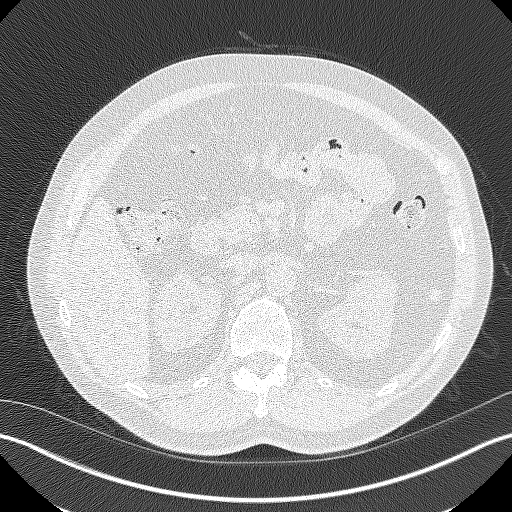
[im 45/324  lung]
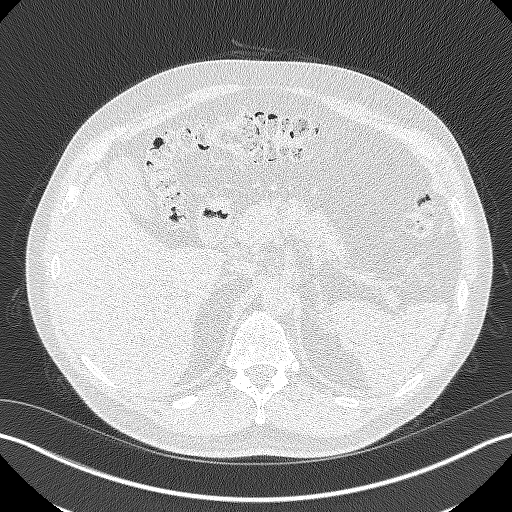
[im 74/324  lung]
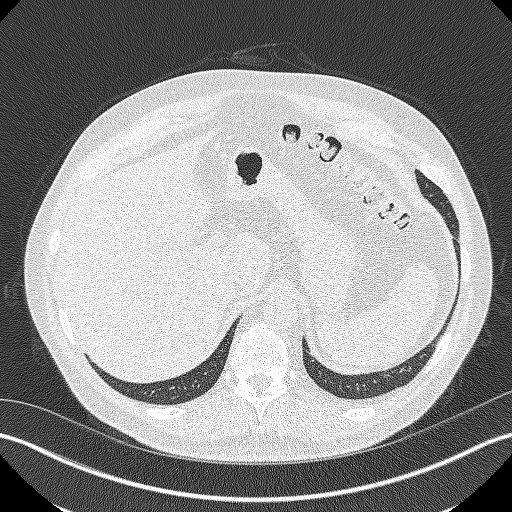
[im 103/324  lung]
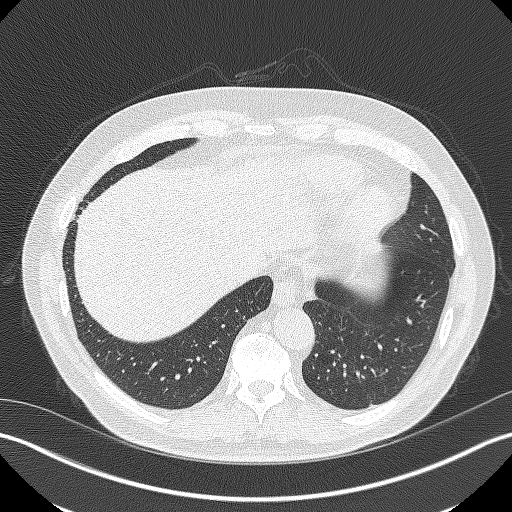
[im 118/324  mediastinal]
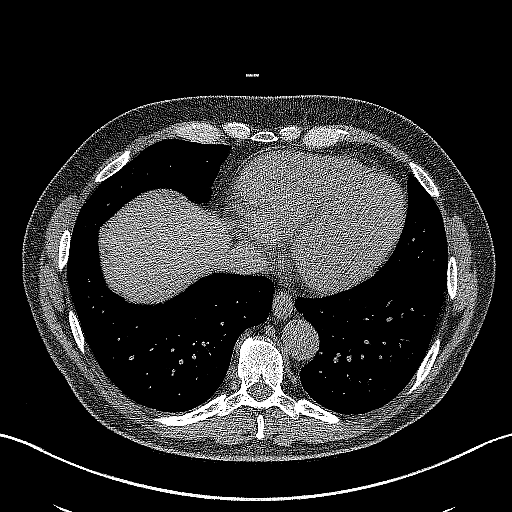
[im 118/324  lung]
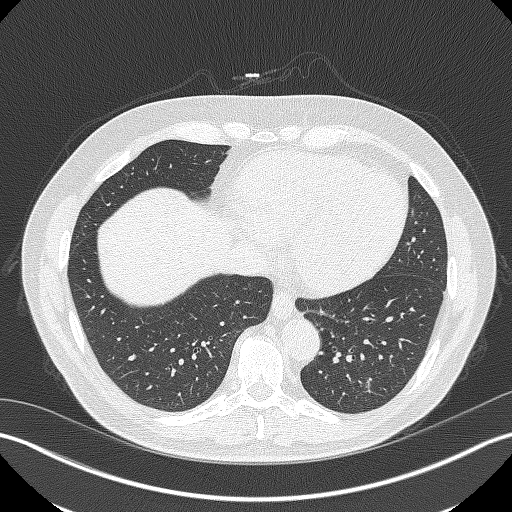
[im 147/324  lung]
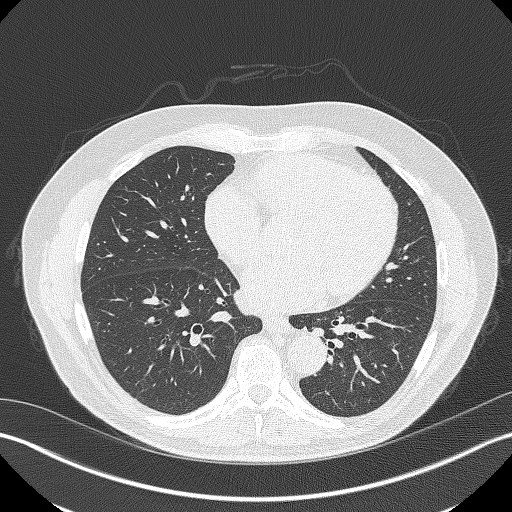
[im 177/324  lung]
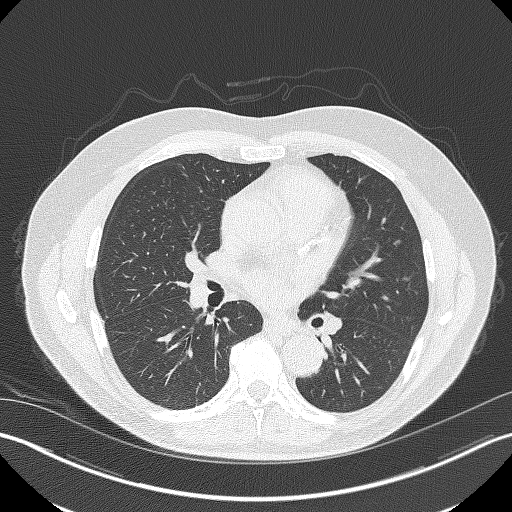
[im 206/324  lung]
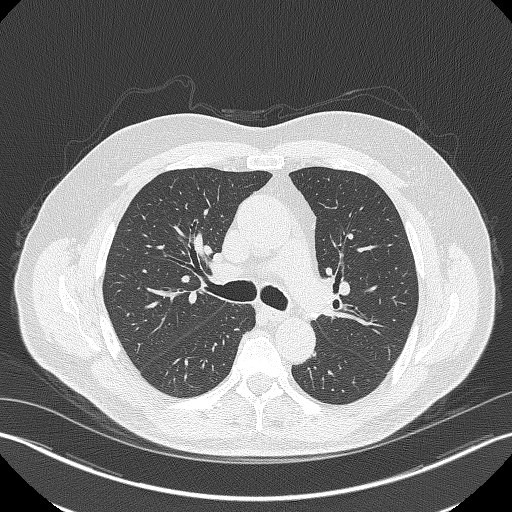
[im 221/324  mediastinal]
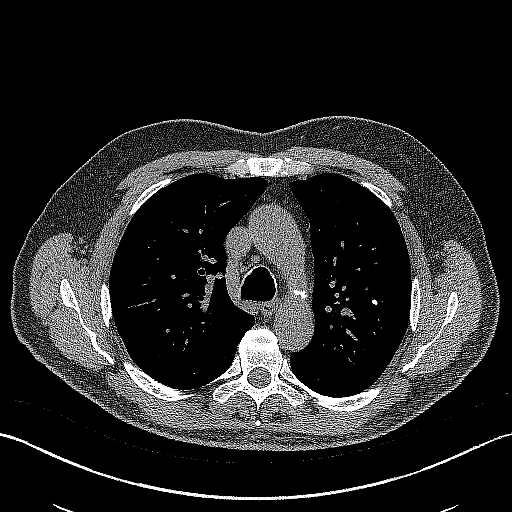
[im 221/324  lung]
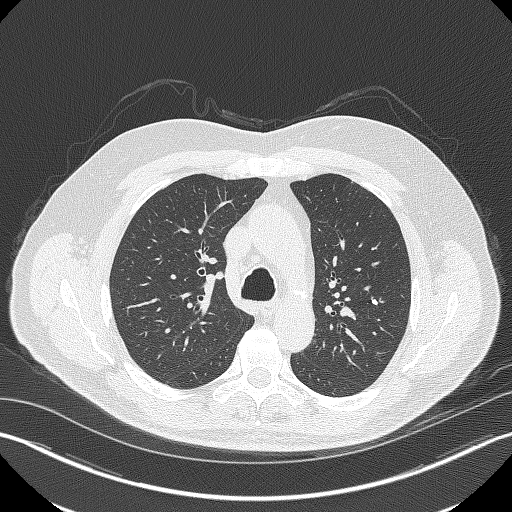
[im 250/324  lung]
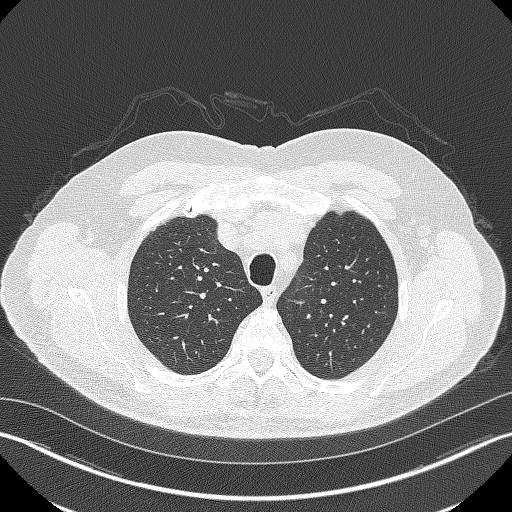
[im 279/324  lung]
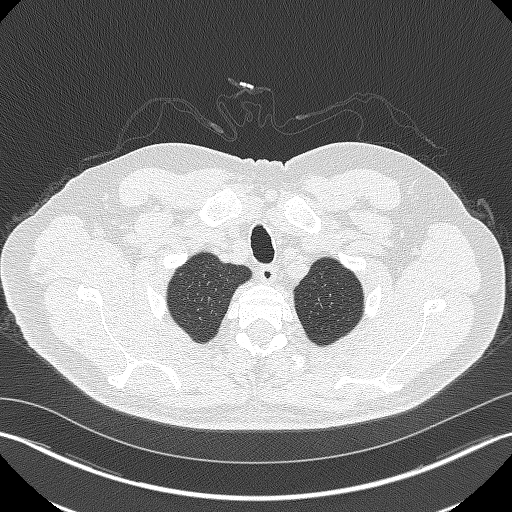
[im 309/324  lung]
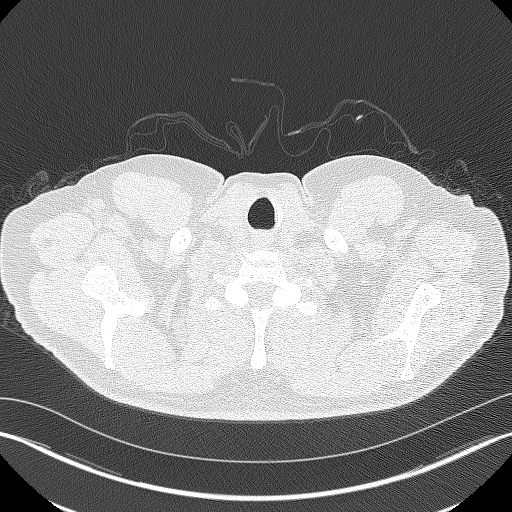

[15 of 40 positions shown; findings below may reference images not displayed]

FINDINGS: Cardiovascular: Normal heart size. No pericardial effusion. Aortic
atherosclerosis. Calcification in the LAD, left circumflex coronary
arteries identified.

Mediastinum/Nodes: No enlarged mediastinal, hilar, or axillary lymph
nodes. Thyroid gland, trachea, and esophagus demonstrate no
significant findings.

Lungs/Pleura: No pleural effusion. No airspace consolidation,
atelectasis or pneumothorax. Mild changes of emphysema. Within the
left lower lobe there are mild peribronchial micronodularity and
faint ground-glass attenuation, likely post infectious or
inflammatory in etiology. Within the right middle lobe and right
upper lobe there are several small perifissural nodules. The largest
measures 3.3 mm.

Upper Abdomen: Calcified granulomas identified within the spleen. No
acute abnormality identified.

Musculoskeletal: No chest wall mass or suspicious bone lesions
identified.
IMPRESSION: 1. Lung-RADS 2, benign appearance or behavior. Continue annual
screening with low-dose chest CT without contrast in 12 months.
2. Aortic Atherosclerosis (YYGEP-W1R.R) and Emphysema (YYGEP-AMD.V).
3. Coronary artery atherosclerotic calcifications.

## 2019-10-11 IMAGING — US US ABDOMINAL AORTA SCREENING AAA
1 series · 14 of 17 positions shown · non-contrast
Comparison: None.

CLINICAL DATA: Appropriate age.  First time exam.  Smoking history.

EXAM:
US ABDOMINAL AORTA MEDICARE SCREENING
TECHNIQUE: Ultrasound examination of the abdominal aorta was performed as a
screening evaluation for abdominal aortic aneurysm.

[Series 1: us abdominal aorta screening aaa · 0.34mm/px · 14 of 17 slices shown]
[im 1/17]
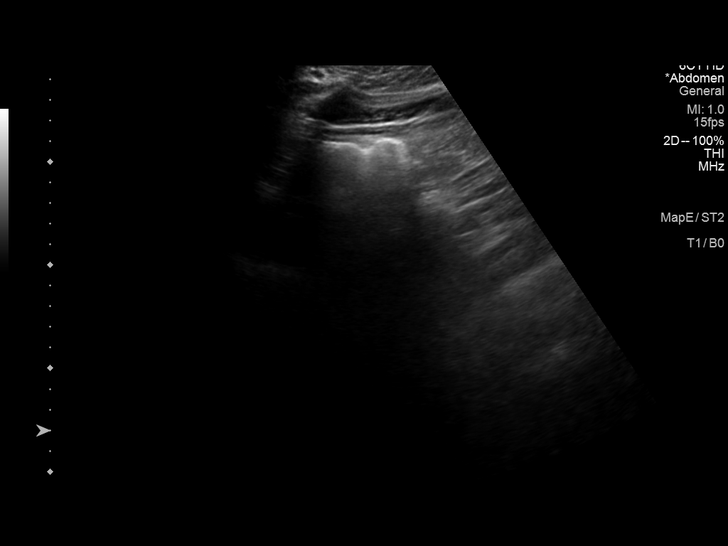
[im 2/17]
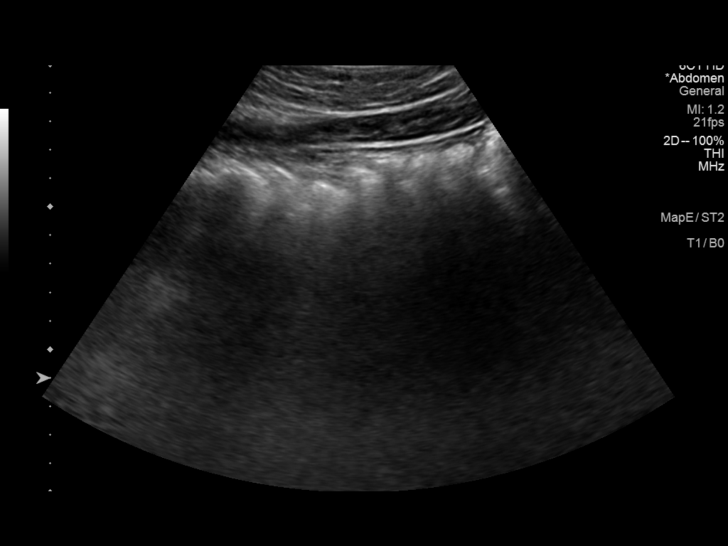
[im 4/17]
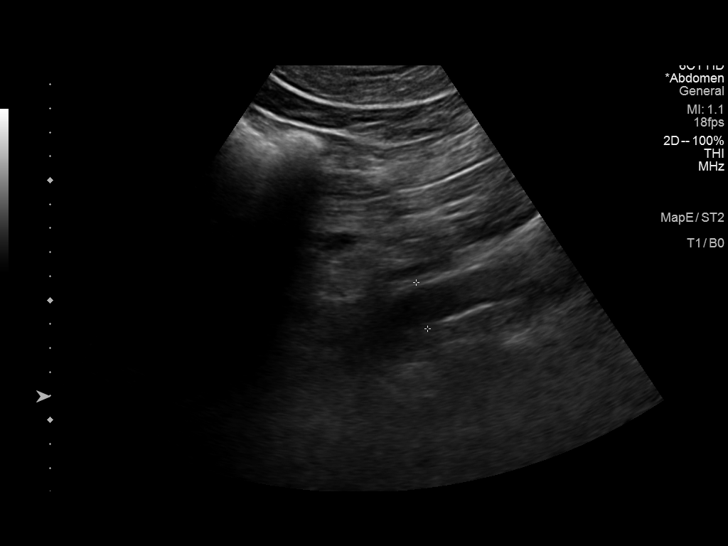
[im 5/17]
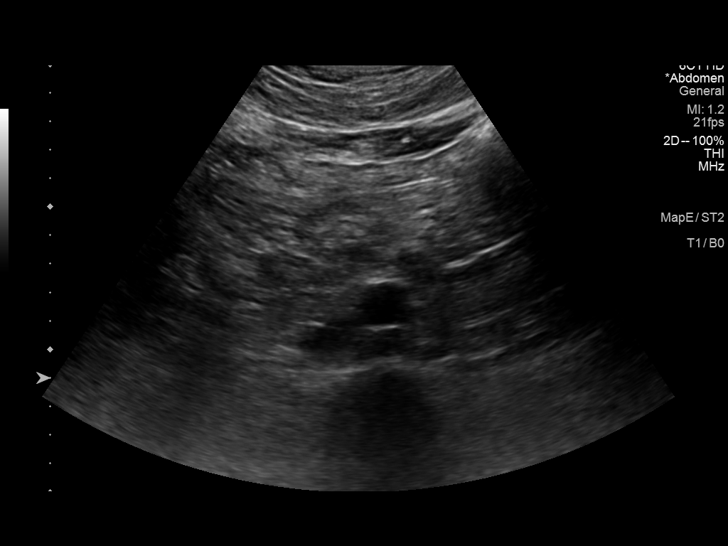
[im 6/17]
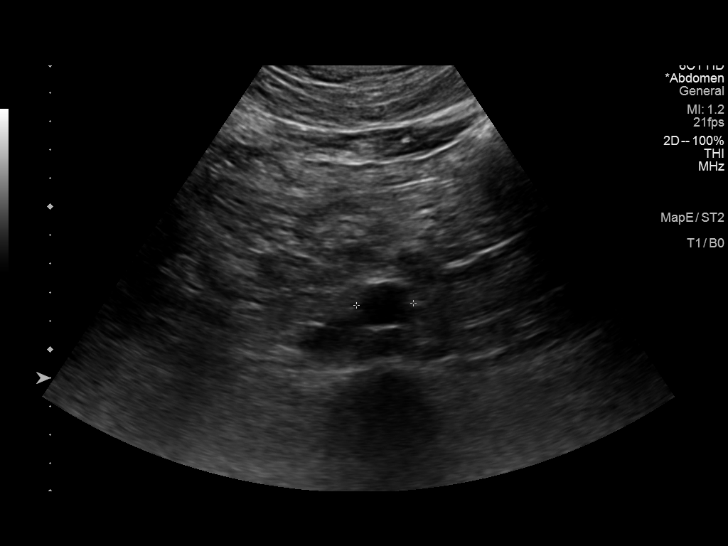
[im 7/17]
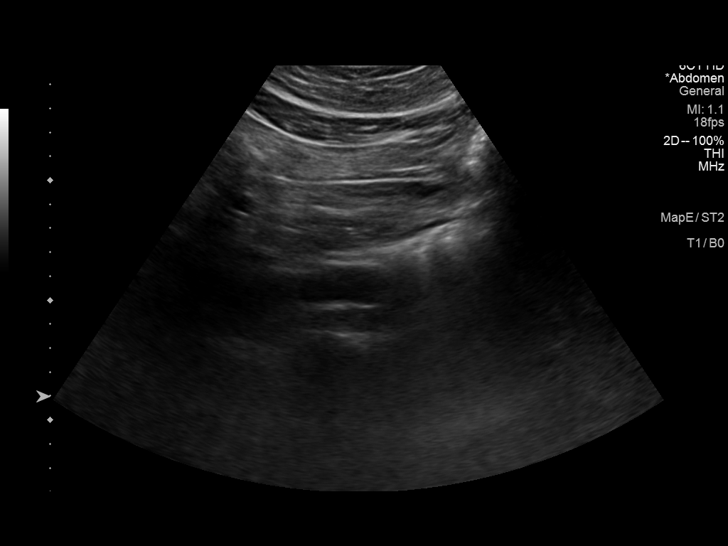
[im 8/17]
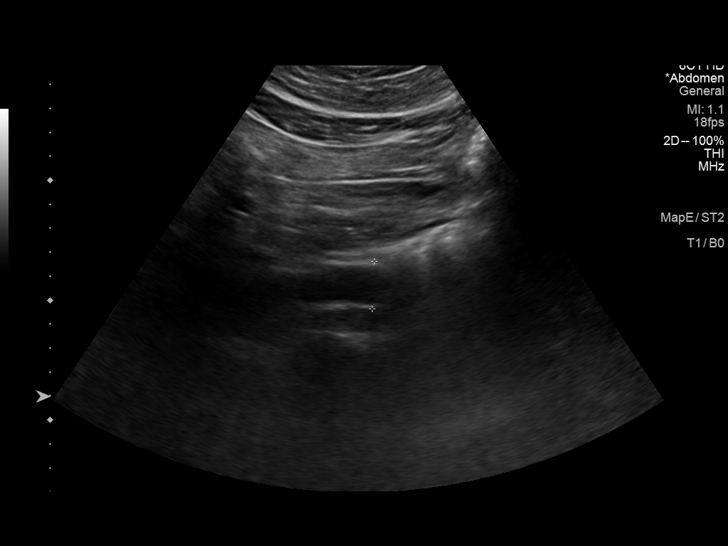
[im 10/17]
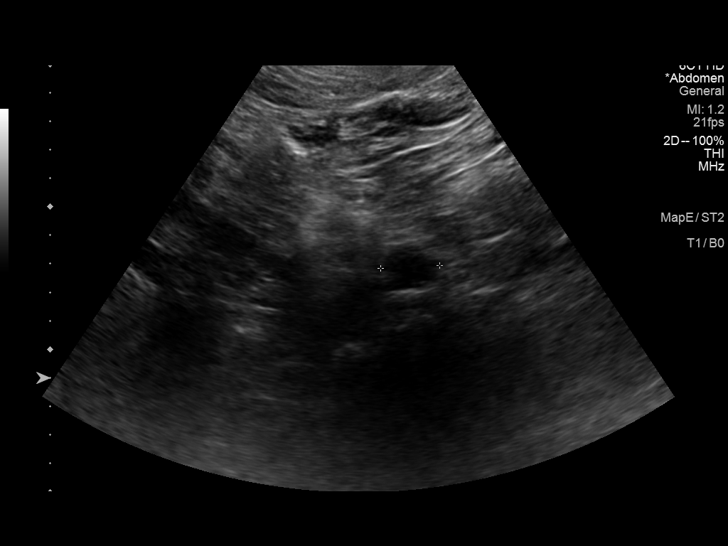
[im 11/17]
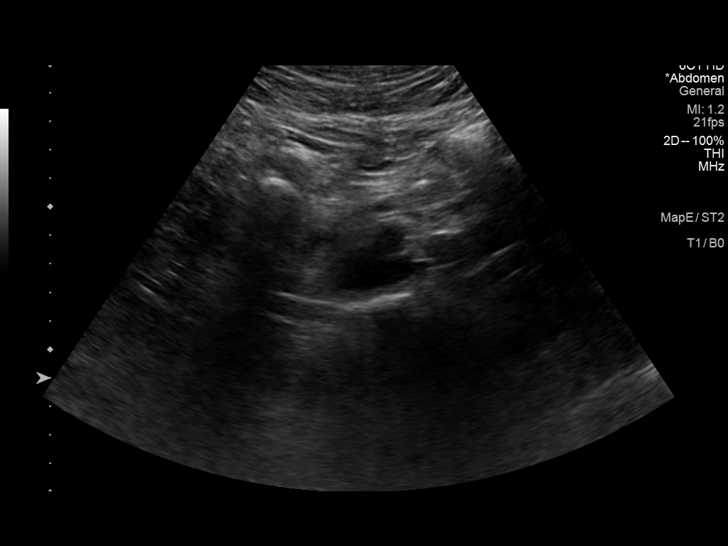
[im 12/17]
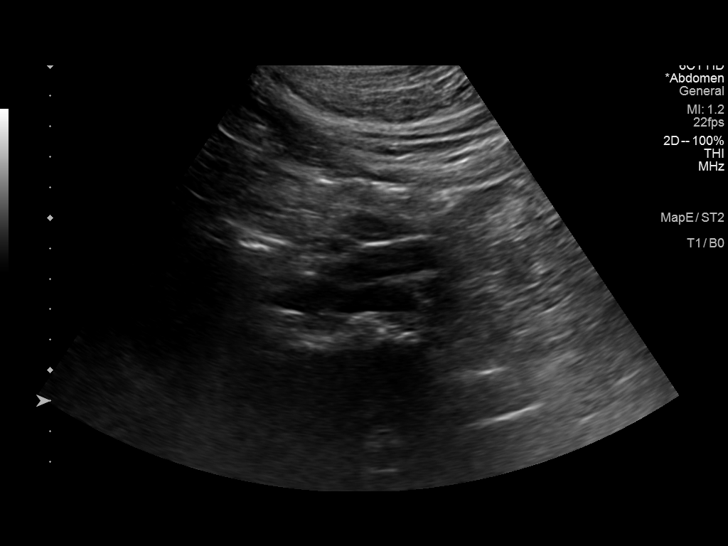
[im 13/17]
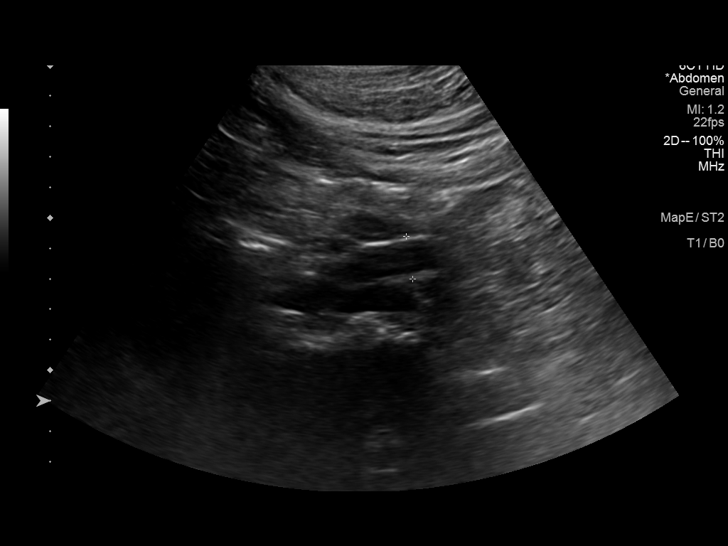
[im 14/17]
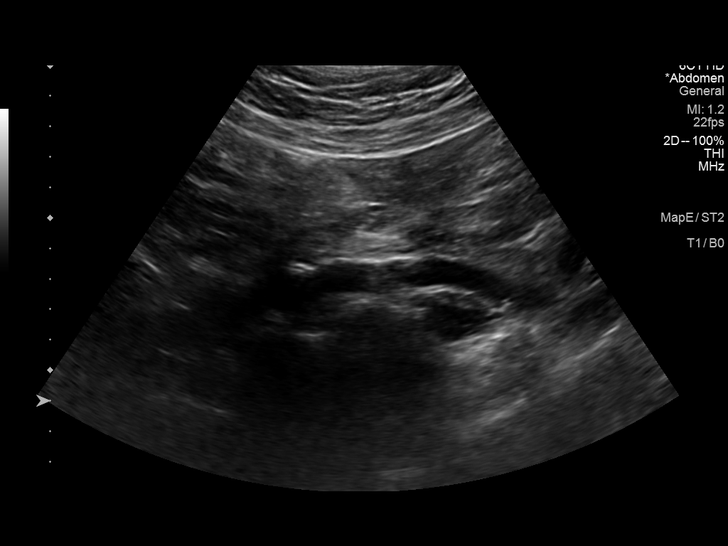
[im 16/17]
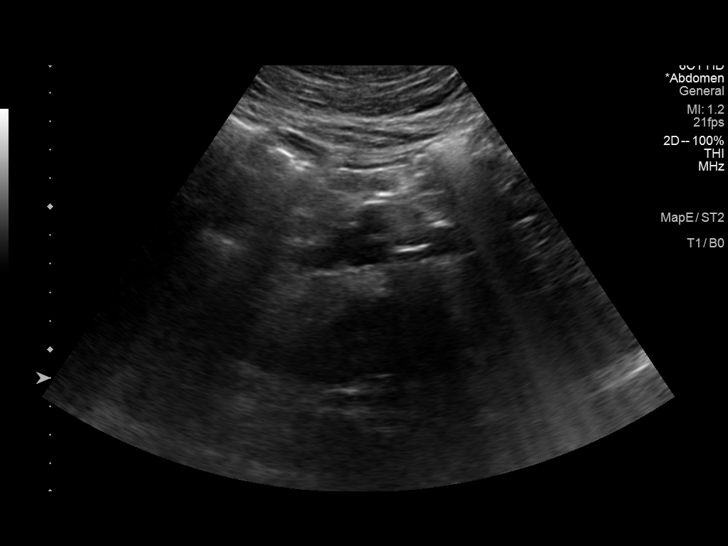
[im 17/17]
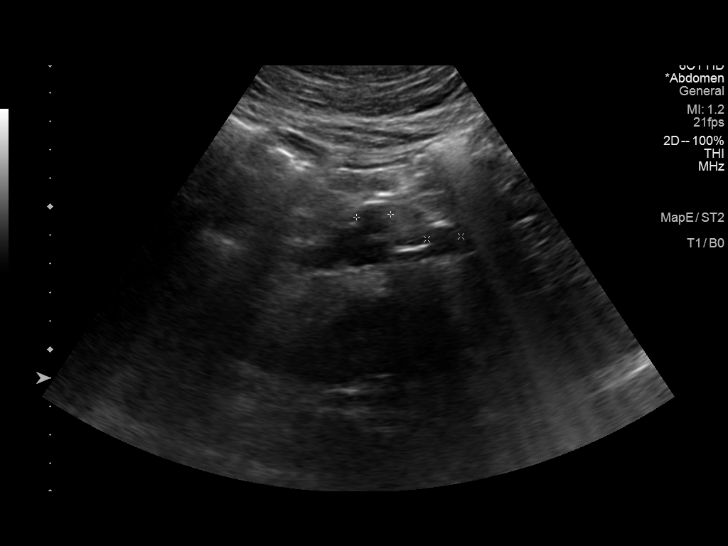

[14 of 17 positions shown; findings below may reference images not displayed]

FINDINGS: Abdominal aortic measurements as follows:

Proximal:  Obscured by overlying bowel gas.

Mid:  2.0 cm cm

Distal:  2.1 cm cm
IMPRESSION: The proximal aorta was obscured limiting the examination.

Maximal visualized caliber is 2.1 cm.  This is nonaneurysmal.

Because of the limitations, consider repeat exam in the future.

## 2019-10-14 ENCOUNTER — Other Ambulatory Visit: Payer: Self-pay | Admitting: Physician Assistant

## 2019-11-11 ENCOUNTER — Telehealth: Payer: Self-pay | Admitting: Physician Assistant

## 2019-11-11 NOTE — Telephone Encounter (Signed)
Patient last seen Jan 2021 and advised then by [facility] to follow up in 6 months.   Patient TSH level was checked 1/21. Patient does not feel that he needs apt now bc his TSH was normal and is requesting enough medication until January when he will then follow up for yearly exam.   I advised patient of our refill policy and he wants me to send this request to the provider.   Please review chart and advise. AS, CMA

## 2019-11-11 NOTE — Telephone Encounter (Signed)
Left message with this information for patient and to call to schedule apt for further refills. AS, CMA

## 2019-11-11 NOTE — Telephone Encounter (Signed)
Patient should follow-up as advised. We need to do regular follow-up of chronic conditions even if patient is feeling/doing well, and 6 months is the maximum follow-up.  Thank you, Herb Grays

## 2019-11-11 NOTE — Telephone Encounter (Signed)
Patient called requesting a full refill of his thyroid med. He states he got a 30 day supply last time but wants at minimum a 90 day supply. I advised patient that at last OV (MWE in Jan) that [name] advised a 6 mnth f/u and thus why he got a limited supply. Patient says he doesn't need this appt as he is doing well and has had no changes in health. I advised patient that I would send message to clinic staff about refill and his request to change care plan. If approved please send refill to [facility]

## 2019-11-19 ENCOUNTER — Ambulatory Visit (INDEPENDENT_AMBULATORY_CARE_PROVIDER_SITE_OTHER): Payer: PPO | Admitting: Physician Assistant

## 2019-11-19 ENCOUNTER — Encounter: Payer: Self-pay | Admitting: Physician Assistant

## 2019-11-19 ENCOUNTER — Other Ambulatory Visit: Payer: Self-pay

## 2019-11-19 VITALS — BP 111/68 | HR 63 | Temp 98.0°F | Ht 68.5 in | Wt 191.6 lb

## 2019-11-19 DIAGNOSIS — E785 Hyperlipidemia, unspecified: Secondary | ICD-10-CM | POA: Diagnosis not present

## 2019-11-19 DIAGNOSIS — Z23 Encounter for immunization: Secondary | ICD-10-CM

## 2019-11-19 DIAGNOSIS — E039 Hypothyroidism, unspecified: Secondary | ICD-10-CM | POA: Diagnosis not present

## 2019-11-19 MED ORDER — LEVOTHYROXINE SODIUM 112 MCG PO TABS: 112.0000 ug | ORAL_TABLET | Freq: Every day | ORAL | 2 refills | Status: DC

## 2019-11-19 MED ORDER — SHINGRIX 50 MCG/0.5ML IM SUSR: 0.5000 mL | Freq: Once | INTRAMUSCULAR | 0 refills | Status: AC

## 2019-11-19 NOTE — Progress Notes (Signed)
Established Patient Office Visit  Subjective:  Patient ID: Joshua Moreno, male    DOB: 05-11-1950  Age: 69 y.o. MRN: [id number]  CC:  Chief Complaint  Patient presents with  . Hypothyroidism    HPI Joshua Moreno presents for hypothyroid. Pt states he knows when his thyroid is off and currently feels fine. He has been on the same dose of levothyroxine for a while. He continues to stay active with tennis. Pt is followed by cardiology- Dr. Acie Fredrickson for hyperlipidemia and RBBB, and Dr. Acie Fredrickson changed his cholesterol medication from atorvastatin to rosuvastatin 10 mg which is working better.   Past Medical History:  Diagnosis Date  . Hyperlipidemia   . Personal history of colonic polyps-adenoma 06/26/2008  . Skin cancer of face    Actinic keratoses; followed annually by [facility]  . Thyroid disease     Past Surgical History:  Procedure Laterality Date  . COLONOSCOPY    . HYDROCELE EXCISION / REPAIR     R scrotum  . POLYPECTOMY    . tail bone      Family History  Problem Relation Age of Onset  . Heart disease Father 86       CABG/CAD  . Alzheimer's disease Mother   . Colon cancer Neg Hx   . Esophageal cancer Neg Hx   . Rectal cancer Neg Hx   . Stomach cancer Neg Hx   . Colon polyps Neg Hx     Social History   Socioeconomic History  . Marital status: Widowed    Spouse name: Not on file  . Number of children: Not on file  . Years of education: Not on file  . Highest education level: Not on file  Occupational History  . Occupation: retired  Tobacco Use  . Smoking status: Former Smoker    Packs/day: 1.00    Years: 35.00    Pack years: 35.00    Types: Cigarettes    Quit date: 12/07/2003    Years since quitting: 16.0  . Smokeless tobacco: Never Used  Vaping Use  . Vaping Use: Never used  Substance and Sexual Activity  . Alcohol use: Yes    Alcohol/week: 16.0 standard drinks    Types: 4 Cans of beer, 6 Shots of liquor, 6 Standard drinks or equivalent  per week  . Drug use: No  . Sexual activity: Yes    Birth control/protection: None  Other Topics Concern  . 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      Tobacco: quit in 2000.  1 ppd x 30 years.      Alcohol:  Beer or bourbon daily; 5-8 drinks per week.        Exercise:  Plays tennis four days weekly. [employer].      ADLs: independent. Drives.      Advanced Directives: FULL CODE; no prolonged resuscitation.        Seatbelt: 100%; no texting while driving.           Social Determinants of Health   Financial Resource Strain:   . Difficulty of Paying Living Expenses:   Food Insecurity:   . Worried About Charity fundraiser in the  Last Year:   . [employer] in the Last Year:   Transportation Needs:   . Film/video editor (Medical):   Marland Kitchen Lack of Transportation (Non-Medical):   Physical Activity:   . Days of Exercise per Week:   . Minutes of Exercise per Session:   Stress:   . Feeling of Stress :   Social Connections:   . Frequency of Communication with Friends and Family:   . Frequency of Social Gatherings with Friends and Family:   . Attends Religious Services:   . Active Member of Clubs or Organizations:   . Attends Archivist Meetings:   Marland Kitchen Marital Status:   Intimate Partner Violence:   . Fear of Current or Ex-Partner:   . Emotionally Abused:   Marland Kitchen Physically Abused:   . Sexually Abused:     Outpatient Medications Prior to Visit  Medication Sig Dispense Refill  . Ascorbic Acid (VITAMIN C) 100 MG tablet Take 200 mg by mouth daily.    Marland Kitchen aspirin EC 81 MG tablet Take 81 mg by mouth every other day.    . pyridOXINE (VITAMIN B-6) 100 MG tablet Take 100 mg by mouth daily.    . rosuvastatin (CRESTOR) 10 MG tablet Take 1 tablet (10 mg total) by  mouth daily. 90 tablet 3  . levothyroxine (SYNTHROID) 112 MCG tablet Take 1 tablet (112 mcg total) by mouth daily. **PATIENT NEEDS APT FOR FURTHER REFILLS** 30 tablet 0   No facility-administered medications prior to visit.    Allergies  Allergen Reactions  . Cinnamon Hives    ROS Review of Systems  A fourteen system review of systems was performed and found to be positive as per HPI.   Objective:    Physical Exam  General:  Well Developed, well nourished, appropriate for stated age.  Neuro:  Alert and oriented,  extra-ocular muscles intact, no focal deficits  HEENT:  Normocephalic, atraumatic, neck supple, no carotid bruits appreciated  Skin:  no gross rash, warm, pink. Cardiac:  RRR, S1 S2, no murmur Respiratory:  ECTA B/L and A/P, Not using accessory muscles, speaking in full sentences- unlabored. Vascular:  Ext warm, no cyanosis apprec.; cap RF less 2 sec. Psych:  No HI/SI, judgement and insight good, Euthymic mood. Full Affect.  BP 111/68   Pulse 63   Temp 98 F (36.7 C) (Oral)   Ht 5' 8.5" (1.74 m)   Wt 191 lb 9.6 oz (86.9 kg)   SpO2 97% Comment: on RA  BMI 28.71 kg/m  Wt Readings from Last 3 Encounters:  11/19/19 191 lb 9.6 oz (86.9 kg)  07/10/19 193 lb 6.4 oz (87.7 kg)  05/20/19 186 lb (84.4 kg)     Health Maintenance Due  Topic Date Due  . COVID-19 Vaccine (1) Never done    There are no preventive care reminders to display for this patient.  Lab Results  Component Value Date   TSH 1.070 05/13/2019   Lab Results  Component Value Date   WBC 7.5 05/13/2019   HGB 16.1 05/13/2019   HCT 47.3 05/13/2019   MCV 95 05/13/2019   PLT 272 05/13/2019   Lab Results  Component Value Date   NA 144 05/13/2019   K 4.5 05/13/2019   CO2 25 05/13/2019   GLUCOSE 97 05/13/2019   BUN 14 05/13/2019   CREATININE 1.20 05/13/2019   BILITOT 0.6 05/13/2019   ALKPHOS 90 05/13/2019   AST 45 (H) 05/13/2019   ALT 55 (H) 05/13/2019  PROT 6.4 05/13/2019   ALBUMIN 4.1  05/13/2019   CALCIUM 9.6 05/13/2019   Lab Results  Component Value Date   CHOL 164 05/13/2019   Lab Results  Component Value Date   HDL 74 05/13/2019   Lab Results  Component Value Date   LDLCALC 79 05/13/2019   Lab Results  Component Value Date   TRIG 56 05/13/2019   Lab Results  Component Value Date   CHOLHDL 2.2 05/13/2019   Lab Results  Component Value Date   HGBA1C 5.3 05/13/2019      Assessment & Plan:   Problem List Items Addressed This Visit      Endocrine   Hypothyroidism - Primary    -Asymptomatic. -Per chart review, TSH has been stable. Will recheck TSH at next OV in 6 months. -Continue Levothyroxine 1125 mcg. Provided refills.       Relevant Medications   levothyroxine (SYNTHROID) 112 MCG tablet     Other   Hyperlipidemia    - Last lipid panel wnl's. - Followed by cardiology. - Continue Rosuvastatin 10 mg. - Follow heart healthy diet and continue to stay active with tennis.       Other Visit Diagnoses    Need for shingles vaccine          Meds ordered this encounter  Medications  . levothyroxine (SYNTHROID) 112 MCG tablet    Sig: Take 1 tablet (112 mcg total) by mouth daily.    Dispense:  90 tablet    Refill:  2    Order Specific Question:   Supervising Provider    Answer:   Beatrice Lecher D [[id number]]  . Zoster Vaccine Adjuvanted [facility]) injection    Sig: Inject 0.5 mLs into the muscle once for 1 dose.    Dispense:  0.5 mL    Refill:  0    Follow-up: Return in about 6 months (around 05/21/2020) for Hypothyroid.    Lorrene Reid, PA-C

## 2019-11-19 NOTE — Assessment & Plan Note (Addendum)
-   Last lipid panel wnl's. - Followed by cardiology. - Continue Rosuvastatin 10 mg. - Follow heart healthy diet and continue to stay active with tennis.

## 2019-11-19 NOTE — Assessment & Plan Note (Addendum)
-  Asymptomatic. -Per chart review, TSH has been stable. Will recheck TSH at next OV in 6 months. -Continue Levothyroxine 125 mcg. Provided refills.

## 2019-11-24 ENCOUNTER — Encounter: Payer: Self-pay | Admitting: Physician Assistant

## 2020-02-23 DIAGNOSIS — L820 Inflamed seborrheic keratosis: Secondary | ICD-10-CM | POA: Diagnosis not present

## 2020-02-23 DIAGNOSIS — L814 Other melanin hyperpigmentation: Secondary | ICD-10-CM | POA: Diagnosis not present

## 2020-02-23 DIAGNOSIS — D225 Melanocytic nevi of trunk: Secondary | ICD-10-CM | POA: Diagnosis not present

## 2020-02-23 DIAGNOSIS — D692 Other nonthrombocytopenic purpura: Secondary | ICD-10-CM | POA: Diagnosis not present

## 2020-02-23 DIAGNOSIS — L821 Other seborrheic keratosis: Secondary | ICD-10-CM | POA: Diagnosis not present

## 2020-02-23 DIAGNOSIS — L570 Actinic keratosis: Secondary | ICD-10-CM | POA: Diagnosis not present

## 2020-02-23 DIAGNOSIS — Z85820 Personal history of malignant melanoma of skin: Secondary | ICD-10-CM | POA: Diagnosis not present

## 2020-02-23 DIAGNOSIS — D1801 Hemangioma of skin and subcutaneous tissue: Secondary | ICD-10-CM | POA: Diagnosis not present

## 2020-03-03 DIAGNOSIS — H35371 Puckering of macula, right eye: Secondary | ICD-10-CM | POA: Diagnosis not present

## 2020-05-16 ENCOUNTER — Other Ambulatory Visit: Payer: Self-pay | Admitting: Physician Assistant

## 2020-05-16 DIAGNOSIS — E039 Hypothyroidism, unspecified: Secondary | ICD-10-CM

## 2020-05-16 DIAGNOSIS — E785 Hyperlipidemia, unspecified: Secondary | ICD-10-CM

## 2020-05-16 DIAGNOSIS — Z0000 Encounter for general adult medical examination without abnormal findings: Secondary | ICD-10-CM

## 2020-05-18 ENCOUNTER — Other Ambulatory Visit: Payer: PPO

## 2020-05-18 ENCOUNTER — Other Ambulatory Visit: Payer: Self-pay

## 2020-05-18 DIAGNOSIS — Z0000 Encounter for general adult medical examination without abnormal findings: Secondary | ICD-10-CM

## 2020-05-18 DIAGNOSIS — E039 Hypothyroidism, unspecified: Secondary | ICD-10-CM | POA: Diagnosis not present

## 2020-05-18 DIAGNOSIS — E785 Hyperlipidemia, unspecified: Secondary | ICD-10-CM

## 2020-05-19 LAB — COMPREHENSIVE METABOLIC PANEL
ALT: 49 IU/L — ABNORMAL HIGH (ref 0–44)
AST: 39 IU/L (ref 0–40)
Albumin/Globulin Ratio: 1.7 (ref 1.2–2.2)
Albumin: 3.9 g/dL (ref 3.7–4.7)
Alkaline Phosphatase: 88 IU/L (ref 44–121)
BUN/Creatinine Ratio: 12 (ref 10–24)
BUN: 14 mg/dL (ref 8–27)
Bilirubin Total: 0.6 mg/dL (ref 0.0–1.2)
CO2: 24 mmol/L (ref 20–29)
Calcium: 9.2 mg/dL (ref 8.6–10.2)
Chloride: 104 mmol/L (ref 96–106)
Creatinine, Ser: 1.18 mg/dL (ref 0.76–1.27)
GFR calc Af Amer: 71 mL/min/{1.73_m2} (ref >59)
GFR calc non Af Amer: 62 mL/min/{1.73_m2} (ref >59)
Globulin, Total: 2.3 g/dL (ref 1.5–4.5)
Glucose: 101 mg/dL — ABNORMAL HIGH (ref 65–99)
Potassium: 4.8 mmol/L (ref 3.5–5.2)
Sodium: 141 mmol/L (ref 134–144)
Total Protein: 6.2 g/dL (ref 6.0–8.5)

## 2020-05-19 LAB — CBC
Hematocrit: 45 % (ref 37.5–51.0)
Hemoglobin: 15.4 g/dL (ref 13.0–17.7)
MCH: 32.4 pg (ref 26.6–33.0)
MCHC: 34.2 g/dL (ref 31.5–35.7)
MCV: 95 fL (ref 79–97)
Platelets: 328 10*3/uL (ref 150–450)
RBC: 4.75 x10E6/uL (ref 4.14–5.80)
RDW: 11.7 % (ref 11.6–15.4)
WBC: 7.4 10*3/uL (ref 3.4–10.8)

## 2020-05-19 LAB — HEMOGLOBIN A1C
Est. average glucose Bld gHb Est-mCnc: 108 mg/dL
Hgb A1c MFr Bld: 5.4 % (ref 4.8–5.6)

## 2020-05-19 LAB — LIPID PANEL
Chol/HDL Ratio: 2.5 ratio (ref 0.0–5.0)
Cholesterol, Total: 166 mg/dL (ref 100–199)
HDL: 66 mg/dL (ref >39)
LDL Chol Calc (NIH): 87 mg/dL (ref 0–99)
Triglycerides: 68 mg/dL (ref 0–149)
VLDL Cholesterol Cal: 13 mg/dL (ref 5–40)

## 2020-05-19 LAB — TSH: TSH: 2.19 u[IU]/mL (ref 0.450–4.500)

## 2020-05-20 ENCOUNTER — Other Ambulatory Visit: Payer: PPO

## 2020-05-25 ENCOUNTER — Ambulatory Visit: Payer: PPO | Admitting: Physician Assistant

## 2020-05-26 ENCOUNTER — Other Ambulatory Visit: Payer: Self-pay

## 2020-05-26 ENCOUNTER — Ambulatory Visit (INDEPENDENT_AMBULATORY_CARE_PROVIDER_SITE_OTHER): Payer: PPO | Admitting: Physician Assistant

## 2020-05-26 ENCOUNTER — Encounter: Payer: Self-pay | Admitting: Physician Assistant

## 2020-05-26 VITALS — BP 135/72 | HR 61 | Ht 68.0 in | Wt 191.7 lb

## 2020-05-26 DIAGNOSIS — Z0000 Encounter for general adult medical examination without abnormal findings: Secondary | ICD-10-CM | POA: Diagnosis not present

## 2020-05-26 NOTE — Patient Instructions (Addendum)

## 2020-05-26 NOTE — Progress Notes (Signed)
Subjective:   Joshua Moreno is a 70 y.o. male who presents for Medicare Annual/Subsequent preventive examination.  Review of Systems    General:   No F/C, wt loss Pulm:   No DIB, SOB, pleuritic chest pain Card:  No CP, palpitations Abd:  No n/v/d or pain Ext:  No inc edema from baseline     Objective:    Today's Vitals   05/26/20 1344  BP: 135/72  Pulse: 61  SpO2: 96%  Weight: 191 lb 11.2 oz (87 kg)  Height: 5\' 8"  (1.727 m)   Body mass index is 29.15 kg/m.  Advanced Directives 06/05/2019 03/13/2018 02/13/2017 01/11/2016 03/29/2015 01/06/2015  Does Patient Have a Medical Advance Directive? No Yes Yes Yes Yes Yes  Type of Advance Directive - - [place];Living will [place];Living will [place];Living will -  Does patient want to make changes to medical advance directive? - No - Patient declined - - No - Patient declined -  Copy of [place] in Chart? - - No - copy requested Yes No - copy requested No - copy requested  Would patient like information on creating a medical advance directive? No - Patient declined - - - - -    Current Medications (verified) Outpatient Encounter Medications as of 05/26/2020  Medication Sig  . Ascorbic Acid (VITAMIN C) 100 MG tablet Take 200 mg by mouth daily.  Marland Kitchen aspirin EC 81 MG tablet Take 81 mg by mouth every other day.  . levothyroxine (SYNTHROID) 112 MCG tablet Take 1 tablet (112 mcg total) by mouth daily.  Marland Kitchen pyridOXINE (VITAMIN B-6) 100 MG tablet Take 100 mg by mouth daily.  . rosuvastatin (CRESTOR) 10 MG tablet Take 1 tablet (10 mg total) by mouth daily.   No facility-administered encounter medications on file as of 05/26/2020.    Allergies (verified) Cinnamon   History: Past Medical History:  Diagnosis Date  . Hyperlipidemia   . Personal history of colonic polyps-adenoma 06/26/2008  . Skin cancer of face    Actinic keratoses; followed annually by  [facility]  . Thyroid disease    Past Surgical History:  Procedure Laterality Date  . COLONOSCOPY    . HYDROCELE EXCISION / REPAIR     R scrotum  . POLYPECTOMY    . tail bone     Family History  Problem Relation Age of Onset  . Heart disease Father 54       CABG/CAD  . Alzheimer's disease Mother   . Colon cancer Neg Hx   . Esophageal cancer Neg Hx   . Rectal cancer Neg Hx   . Stomach cancer Neg Hx   . Colon polyps Neg Hx    Social History   Socioeconomic History  . Marital status: Widowed    Spouse name: Not on file  . Number of children: Not on file  . Years of education: Not on file  . Highest education level: Not on file  Occupational History  . Occupation: retired  Tobacco Use  . Smoking status: Former Smoker    Packs/day: 1.00    Years: 35.00    Pack years: 35.00    Types: Cigarettes    Quit date: 12/07/2003    Years since quitting: 16.4  . Smokeless tobacco: Never Used  Vaping Use  . Vaping Use: Never used  Substance and Sexual Activity  . Alcohol use: Yes    Alcohol/week: 16.0 standard drinks  Types: 4 Cans of beer, 6 Shots of liquor, 6 Standard drinks or equivalent per week  . Drug use: No  . Sexual activity: Yes    Birth control/protection: None  Other Topics Concern  . 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      Tobacco: quit in 2000.  1 ppd x 30 years.      Alcohol:  Beer or bourbon daily; 5-8 drinks per week.        Exercise:  Plays tennis four days weekly. [employer].      ADLs: independent. Drives.      Advanced Directives: FULL CODE; no prolonged resuscitation.        Seatbelt: 100%; no texting while driving.           Social Determinants of Health   Financial Resource Strain: Not on file  Food Insecurity:  Not on file  Transportation Needs: Not on file  Physical Activity: Not on file  Stress: Not on file  Social Connections: Not on file    Tobacco Counseling Counseling given: Not Answered    Diabetic? No   Activities of Daily Living In your present state of health, do you have any difficulty performing the following activities: 05/26/2020  Hearing? N  Vision? Y  Difficulty concentrating or making decisions? N  Walking or climbing stairs? N  Dressing or bathing? N  Doing errands, shopping? N  Some recent data might be hidden    Patient Care Team: Lorrene Reid, PA-C as PCP - General (Physician Assistant) Nahser, Wonda Cheng, MD as PCP - Cardiology (Cardiology) [place], Heather, Trail (Optometry) Harriett Sine, MD as Consulting Physician (Dermatology)  Indicate any recent Medical Services you may have received from other than [facility] providers in the past year (date may be approximate).     Assessment:   This is a routine wellness examination for Joshua Moreno.  Hearing/Vision screen No exam data present  Dietary issues and exercise activities discussed:  -Continue to stay active with tennis. Recommend to follow a hearth healthy diet such as Mediterranean diet. Increase water hydration, at least 64 fl oz.  Goals    . Weight (lb) < 180 lb (81.6 kg)     Patient states that he wants to try to lose 5 lbs and start increasing his water intake.       Depression Screen PHQ 2/9 Scores 05/26/2020 11/19/2019 05/20/2019 04/17/2018 03/13/2018 02/21/2017 02/13/2017  PHQ - 2 Score 0 0 0 0 0 0 0  PHQ- 9 Score 0 0 1 0 0 - -    Fall Risk Fall Risk  05/26/2020 05/20/2019 04/17/2018 02/21/2017 02/13/2017  Falls in the past year? 0 0 0 No Yes  Number falls in past yr: - - - - 1  Injury with Fall? - - - - Yes  Comment - - - - concussion   Follow up Falls evaluation completed Falls evaluation completed - - Falls prevention discussed    FALL RISK PREVENTION PERTAINING TO THE HOME:  Any stairs in or  around the home? Yes  If so, are there any without handrails? Yes  Home free of loose throw rugs in walkways, pet beds, electrical cords, etc? Yes  Adequate lighting  in your home to reduce risk of falls? Yes   ASSISTIVE DEVICES UTILIZED TO PREVENT FALLS:  Life alert? No Use of a cane, walker or w/c? No  Grab bars in the bathroom? No  Shower chair or bench in shower? Yes  Elevated toilet seat or a handicapped toilet? Yes   TIMED UP AND GO:  Was the test performed? Yes .  Length of time to ambulate 10 feet 8 sec.   Gait steady and fast without use of assistive device  Cognitive Function: wnl     6CIT Screen 05/26/2020 05/20/2019 02/13/2017  What Year? 0 points 0 points 0 points  What month? 0 points 0 points 0 points  What time? 0 points 0 points 0 points  Count back from 20 0 points 0 points 0 points  Months in reverse 0 points 0 points 0 points  Repeat phrase 0 points 0 points 2 points  Total Score 0 0 2    Immunizations Immunization History  Administered Date(s) Administered  . Influenza Split 05/26/2009  . Influenza, High Dose Seasonal PF 04/17/2018  . Influenza,inj,Quad PF,6+ Mos 01/11/2016, 02/13/2017  . Influenza-Unspecified 02/14/2019  . Pneumococcal Conjugate-13 01/06/2015  . Pneumococcal Polysaccharide-23 05/26/2009, 02/13/2017  . Tdap 11/30/2011, 05/14/2018  . Zoster 05/08/2009    TDAP status: Up to date  Flu Vaccine status: Up to date  Pneumococcal vaccine status: Up to date  Covid-19 vaccine status: Completed vaccines  Qualifies for Shingles Vaccine? Yes   Zostavax completed No   Shingrix Completed?: No.    Education has been provided regarding the importance of this vaccine. Patient has been advised to call insurance company to determine out of pocket expense if they have not yet received this vaccine. Advised may also receive vaccine at local pharmacy or Health Dept. Verbalized acceptance and understanding.  Screening Tests Health Maintenance   Topic Date Due  . COVID-19 Vaccine (1) Never done  . INFLUENZA VACCINE  12/07/2019  . COLONOSCOPY (Pts 45-43yrs Insurance coverage will need to be confirmed)  03/16/2022  . TETANUS/TDAP  05/14/2028  . Hepatitis C Screening  Completed  . PNA vac Low Risk Adult  Completed    Health Maintenance  Health Maintenance Due  Topic Date Due  . COVID-19 Vaccine (1) Never done  . INFLUENZA VACCINE  12/07/2019    Colorectal cancer screening: Type of screening: Colonoscopy. Completed 03/17/2019. Repeat every 3 years  Lung Cancer Screening: (Low Dose CT Chest recommended if Age 84-80 years, 30 pack-year currently smoking OR have quit w/in 15years.) does not qualify.   Lung Cancer Screening Referral:   Additional Screening:  Hepatitis C Screening: does qualify; Completed Patient declined  Vision Screening: Recommended annual ophthalmology exams for early detection of glaucoma and other disorders of the eye. Is the patient up to date with their annual eye exam?  Yes  Who is the provider or what is the name of the office in which the patient attends annual eye exams? Dr. [place] If pt is not established with a provider, would they like to be referred to a provider to establish care? No .   Dental Screening: Recommended annual dental exams for proper oral hygiene  Community Resource Referral / Chronic Care Management: CRR required this visit?  No   CCM required this visit?  No      Plan:  -Discussed most recent labs which are essentially within normal limits or stable from prior.  -Continue current medication regimen. Patient has been on same dose of levothyroxine for  a few years so advised ok to follow up annually and continue to follow up with cardiology as instructed. -Follow up in 1 yr for [facility] and [facility]W 1 week prior or sooner if needed  I have personally reviewed and noted the following in the patient's chart:   . Medical and social history . Use of alcohol, tobacco or illicit drugs   . Current medications and supplements . Functional ability and status . Nutritional status . Physical activity . Advanced directives . List of other physicians . Hospitalizations, surgeries, and ER visits in previous 12 months . Vitals . Screenings to include cognitive, depression, and falls . Referrals and appointments  In addition, I have reviewed and discussed with patient certain preventive protocols, quality metrics, and best practice recommendations. A written personalized care plan for preventive services as well as general preventive health recommendations were provided to patient.

## 2020-07-08 ENCOUNTER — Encounter: Payer: Self-pay | Admitting: Cardiovascular Disease

## 2020-07-08 NOTE — Progress Notes (Signed)
This encounter was created in error - please disregard.

## 2020-07-09 ENCOUNTER — Encounter: Payer: PPO | Admitting: Cardiovascular Disease

## 2020-07-16 ENCOUNTER — Other Ambulatory Visit: Payer: Self-pay | Admitting: Cardiovascular Disease

## 2020-08-18 ENCOUNTER — Ambulatory Visit: Payer: PPO | Admitting: Cardiovascular Disease

## 2020-08-30 ENCOUNTER — Other Ambulatory Visit: Payer: Self-pay | Admitting: Physician Assistant

## 2020-08-30 DIAGNOSIS — E039 Hypothyroidism, unspecified: Secondary | ICD-10-CM

## 2020-09-01 DIAGNOSIS — H35371 Puckering of macula, right eye: Secondary | ICD-10-CM | POA: Diagnosis not present

## 2020-09-06 DIAGNOSIS — Z85820 Personal history of malignant melanoma of skin: Secondary | ICD-10-CM | POA: Diagnosis not present

## 2020-09-06 DIAGNOSIS — C44729 Squamous cell carcinoma of skin of left lower limb, including hip: Secondary | ICD-10-CM | POA: Diagnosis not present

## 2020-09-06 DIAGNOSIS — L814 Other melanin hyperpigmentation: Secondary | ICD-10-CM | POA: Diagnosis not present

## 2020-09-06 DIAGNOSIS — Z85828 Personal history of other malignant neoplasm of skin: Secondary | ICD-10-CM | POA: Diagnosis not present

## 2020-09-06 DIAGNOSIS — L821 Other seborrheic keratosis: Secondary | ICD-10-CM | POA: Diagnosis not present

## 2020-09-06 DIAGNOSIS — L570 Actinic keratosis: Secondary | ICD-10-CM | POA: Diagnosis not present

## 2020-09-08 ENCOUNTER — Ambulatory Visit: Payer: PPO | Admitting: Cardiovascular Disease

## 2020-10-19 ENCOUNTER — Other Ambulatory Visit: Payer: Self-pay | Admitting: Cardiovascular Disease

## 2020-11-17 ENCOUNTER — Encounter: Payer: Self-pay | Admitting: Cardiovascular Disease

## 2020-11-17 NOTE — Progress Notes (Signed)
Cardiology Office Note:    Date:  11/18/2020   ID:  Joshua Moreno, DOB 1950-10-17, MRN [id number]  PCP:  Lorrene Reid, PA-C  Cardiologist:  Mertie Moores, MD  Electrophysiologist:  None   Referring MD: Lorrene Reid, PA-C   1.  Problem list 1.  Coronary artery calcifications 2.  Right bundle branch block 3.  Hyperlipidemia 4.  Hypothyroidism  Chief Complaint  Patient presents with   Abnormal ECG   Hyperlipidemia     Feb. 11, 2020    Joshua Moreno is a 70 y.o. male with a hx of hyperlipidemia and right bundle branch block.  He recently had a CT of the chest for cancer screening and was found to have coronary artery calcifications.   Denies any chest pain or shortness of breath.  He plays competitive tennis 4 times a week.  Can play for 2 hours a day in the summer .    Retired in the Control and instrumentation engineer business.    Lots of volunteer work at [employer] No syncope  Has been on atorvastatin for 10 years . Lipids look okay.  His LDL is 103.  Total cholesterol is 187.  HDL is 67.  Triglyceride level is 87.  July 10, 2019  Joshua Moreno is seen back today for follow-up of his hyperlipidemia and right bundle branch block. Wife passed away last summer.  No CP or dyspnea Still plays tennis 6 day a week .    November 18, 2020; Joshua Moreno is seen back today for his HLD and RBBB Still playing tennis  No Cp , no dyspnea  Still eating some salt -  BP at home is in the 130s    Past Medical History:  Diagnosis Date   Hyperlipidemia    Personal history of colonic polyps-adenoma 06/26/2008   Skin cancer of face    Actinic keratoses; followed annually by [facility]   Thyroid disease     Past Surgical History:  Procedure Laterality Date   COLONOSCOPY     HYDROCELE EXCISION / REPAIR     R scrotum   POLYPECTOMY     tail bone      Current Medications: Current Meds  Medication Sig   Ascorbic Acid (VITAMIN C) 100 MG tablet Take 200 mg by mouth daily.   aspirin EC 81 MG  tablet Take 81 mg by mouth every other day.   levothyroxine (SYNTHROID) 112 MCG tablet TAKE 1 TABLET (112 MCG TOTAL) BY MOUTH DAILY.   pyridOXINE (VITAMIN B-6) 100 MG tablet Take 100 mg by mouth daily.   [DISCONTINUED] rosuvastatin (CRESTOR) 10 MG tablet TAKE 1 TABLET (10 MG TOTAL) BY MOUTH DAILY. PLEASE KEEP UPCOMING APPOINTMENT FOR FURTHER REFILLS     Allergies:   Cinnamon   Social History   Socioeconomic History   Marital status: Widowed    Spouse name: Not on file   Number of children: Not on file   Years of education: Not on file   Highest education level: Not on file  Occupational History   Occupation: retired  Tobacco Use   Smoking status: Former    Packs/day: 1.00    Years: 35.00    Pack years: 35.00    Types: Cigarettes    Quit date: 12/07/2003    Years since quitting: 16.9   Smokeless tobacco: Never  Vaping Use   Vaping Use: Never used  Substance and Sexual Activity   Alcohol use: Yes    Alcohol/week: 16.0 standard drinks    Types:  4 Cans of beer, 6 Shots of liquor, 6 Standard drinks or equivalent per week   Drug use: No   Sexual activity: Yes    Birth control/protection: None  Other Topics Concern   Not on file  Social History Narrative   Marital status: married x 37 years widowed 12/2018 (wife had melanoma)      Children:  3 children; 3 grandchildren; no gg      Lives: with wife      Employment:  Press photographer truck tires x 40 years; 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      Tobacco: quit in 2000.  1 ppd x 30 years.      Alcohol:  Beer or bourbon daily; 5-8 drinks per week.        Exercise:  Plays tennis four days weekly. [employer].      ADLs: independent. Drives.      Advanced Directives: FULL CODE; no prolonged resuscitation.        Seatbelt: 100%; no texting while driving.           Social Determinants of Health   Financial Resource Strain: Not on file  Food Insecurity: Not on file  Transportation Needs: Not on file  Physical Activity: Not on file  Stress: Not on  file  Social Connections: Not on file     Family History: The patient's family history includes Alzheimer's disease in his mother; Heart disease (age of onset: 97) in his father. There is no history of Colon cancer, Esophageal cancer, Rectal cancer, Stomach cancer, or Colon polyps.    ROS:   Please see the history of present illness.     All other systems reviewed and are negative.  EKGs/Labs/Other Studies Reviewed:    The following studies were reviewed today:   EKG:    November 18, 2020:  NSR at  60.  RBBB . No changes from previous   Recent Labs: 05/18/2020: ALT 49; BUN 14; Creatinine, Ser 1.18; Hemoglobin 15.4; Platelets 328; Potassium 4.8; Sodium 141; TSH 2.190  Recent Lipid Panel    Component Value Date/Time   CHOL 166 05/18/2020 0816   TRIG 68 05/18/2020 0816   HDL 66 05/18/2020 0816   CHOLHDL 2.5 05/18/2020 0816   CHOLHDL 2.1 01/11/2016 0819   VLDL 17 01/11/2016 0819   LDLCALC 87 05/18/2020 0816    Physical Exam:    Physical Exam: Blood pressure (!) 144/80, pulse 60, height 5\' 8"  (1.727 m), weight 187 lb (84.8 kg), SpO2 98 %.  GEN:  Well nourished, well developed in no acute distress HEENT: Normal NECK: No JVD; No carotid bruits LYMPHATICS: No lymphadenopathy CARDIAC: RRR , no murmurs, rubs, gallops RESPIRATORY:  Clear to auscultation without rales, wheezing or rhonchi  ABDOMEN: Soft, non-tender, non-distended MUSCULOSKELETAL:  No edema; No deformity  SKIN: Warm and dry NEUROLOGIC:  Alert and oriented x 3      ASSESSMENT:    1. Mixed hyperlipidemia   2. Right bundle branch block (RBBB)   3. RBBB    PLAN:    In order of problems listed above:   Coronary Artery disease:    no angina .  Very active.      2.  Right bundle branch block: stable    3.  Hyperlipidemia -labs from January look good.  She will he will have his lipids checked at his primary medical doctor's office in January.  We will refill his rosuvastatin 10 mg a day.     Medication  Adjustments/Labs and Tests  Ordered: Current medicines are reviewed at length with the patient today.  Concerns regarding medicines are outlined above.  Orders Placed This Encounter  Procedures   EKG 12-Lead    Meds ordered this encounter  Medications   rosuvastatin (CRESTOR) 10 MG tablet    Sig: Take 1 tablet (10 mg total) by mouth daily.    Dispense:  90 tablet    Refill:  0    Patient Instructions  Medication Instructions:  Your physician recommends that you continue on your current medications as directed. Please refer to the Current Medication list given to you today.  *If you need a refill on your cardiac medications before your next appointment, please call your pharmacy*   Lab Work: None Ordered If you have labs (blood work) drawn today and your tests are completely normal, you will receive your results only by: [place] (if you have MyChart) OR A paper copy in the mail If you have any lab test that is abnormal or we need to change your treatment, we will call you to review the results.   Testing/Procedures: None Ordered   Follow-Up: At [facility], you and your health needs are our priority.  As part of our continuing mission to provide you with exceptional heart care, we have created designated Provider Care Teams.  These Care Teams include your primary Cardiologist (physician) and Advanced Practice Providers (APPs -  Physician Assistants and Nurse Practitioners) who all work together to provide you with the care you need, when you need it.   Your next appointment:   1 year(s)  The format for your next appointment:   In Person  Provider:   You may see Mertie Moores, MD or one of the following Advanced Practice Providers on your designated Care Team:   Richardson Dopp, PA-C Robbie Lis, [place]    Signed, Mertie Moores, MD  11/18/2020 9:03 PM    [place]

## 2020-11-18 ENCOUNTER — Ambulatory Visit: Payer: PPO | Admitting: Cardiovascular Disease

## 2020-11-18 ENCOUNTER — Encounter: Payer: Self-pay | Admitting: Cardiovascular Disease

## 2020-11-18 ENCOUNTER — Other Ambulatory Visit: Payer: Self-pay

## 2020-11-18 VITALS — BP 144/80 | HR 60 | Ht 68.0 in | Wt 187.0 lb

## 2020-11-18 DIAGNOSIS — I4510 Unspecified right bundle-branch block: Secondary | ICD-10-CM | POA: Diagnosis not present

## 2020-11-18 DIAGNOSIS — E782 Mixed hyperlipidemia: Secondary | ICD-10-CM | POA: Diagnosis not present

## 2020-11-18 MED ORDER — ROSUVASTATIN CALCIUM 10 MG PO TABS: 10.0000 mg | ORAL_TABLET | Freq: Every day | ORAL | 0 refills | Status: DC

## 2020-11-18 NOTE — Patient Instructions (Signed)

## 2020-11-19 ENCOUNTER — Ambulatory Visit: Payer: PPO | Admitting: Cardiovascular Disease

## 2021-03-09 ENCOUNTER — Other Ambulatory Visit: Payer: Self-pay | Admitting: Physician Assistant

## 2021-03-09 DIAGNOSIS — L814 Other melanin hyperpigmentation: Secondary | ICD-10-CM | POA: Diagnosis not present

## 2021-03-09 DIAGNOSIS — Z85828 Personal history of other malignant neoplasm of skin: Secondary | ICD-10-CM | POA: Diagnosis not present

## 2021-03-09 DIAGNOSIS — L821 Other seborrheic keratosis: Secondary | ICD-10-CM | POA: Diagnosis not present

## 2021-03-09 DIAGNOSIS — E039 Hypothyroidism, unspecified: Secondary | ICD-10-CM

## 2021-03-09 DIAGNOSIS — D1801 Hemangioma of skin and subcutaneous tissue: Secondary | ICD-10-CM | POA: Diagnosis not present

## 2021-03-09 DIAGNOSIS — Z85820 Personal history of malignant melanoma of skin: Secondary | ICD-10-CM | POA: Diagnosis not present

## 2021-03-09 DIAGNOSIS — D225 Melanocytic nevi of trunk: Secondary | ICD-10-CM | POA: Diagnosis not present

## 2021-03-09 DIAGNOSIS — L570 Actinic keratosis: Secondary | ICD-10-CM | POA: Diagnosis not present

## 2021-05-13 ENCOUNTER — Other Ambulatory Visit: Payer: Self-pay | Admitting: Cardiovascular Disease

## 2021-05-23 ENCOUNTER — Other Ambulatory Visit: Payer: PPO

## 2021-05-23 ENCOUNTER — Other Ambulatory Visit: Payer: Self-pay

## 2021-05-23 DIAGNOSIS — E785 Hyperlipidemia, unspecified: Secondary | ICD-10-CM

## 2021-05-23 DIAGNOSIS — E039 Hypothyroidism, unspecified: Secondary | ICD-10-CM

## 2021-05-23 DIAGNOSIS — Z0000 Encounter for general adult medical examination without abnormal findings: Secondary | ICD-10-CM

## 2021-05-24 ENCOUNTER — Other Ambulatory Visit: Payer: Self-pay

## 2021-05-24 ENCOUNTER — Other Ambulatory Visit: Payer: PPO

## 2021-05-24 DIAGNOSIS — E039 Hypothyroidism, unspecified: Secondary | ICD-10-CM

## 2021-05-24 DIAGNOSIS — E785 Hyperlipidemia, unspecified: Secondary | ICD-10-CM | POA: Diagnosis not present

## 2021-05-24 DIAGNOSIS — Z0000 Encounter for general adult medical examination without abnormal findings: Secondary | ICD-10-CM

## 2021-05-25 LAB — COMPREHENSIVE METABOLIC PANEL
ALT: 40 IU/L (ref 0–44)
AST: 36 IU/L (ref 0–40)
Albumin/Globulin Ratio: 1.8 (ref 1.2–2.2)
Albumin: 4.3 g/dL (ref 3.7–4.7)
Alkaline Phosphatase: 100 IU/L (ref 44–121)
BUN/Creatinine Ratio: 12 (ref 10–24)
BUN: 15 mg/dL (ref 8–27)
Bilirubin Total: 0.8 mg/dL (ref 0.0–1.2)
CO2: 24 mmol/L (ref 20–29)
Calcium: 9.5 mg/dL (ref 8.6–10.2)
Chloride: 108 mmol/L — ABNORMAL HIGH (ref 96–106)
Creatinine, Ser: 1.29 mg/dL — ABNORMAL HIGH (ref 0.76–1.27)
Globulin, Total: 2.4 g/dL (ref 1.5–4.5)
Glucose: 105 mg/dL — ABNORMAL HIGH (ref 70–99)
Potassium: 4.7 mmol/L (ref 3.5–5.2)
Sodium: 146 mmol/L — ABNORMAL HIGH (ref 134–144)
Total Protein: 6.7 g/dL (ref 6.0–8.5)
eGFR: 59 mL/min/{1.73_m2} — ABNORMAL LOW (ref >59)

## 2021-05-25 LAB — CBC WITH DIFFERENTIAL/PLATELET
Basophils Absolute: 0.1 10*3/uL (ref 0.0–0.2)
Basos: 1 %
EOS (ABSOLUTE): 0.3 10*3/uL (ref 0.0–0.4)
Eos: 4 %
Hematocrit: 47.6 % (ref 37.5–51.0)
Hemoglobin: 16.3 g/dL (ref 13.0–17.7)
Immature Grans (Abs): 0 10*3/uL (ref 0.0–0.1)
Immature Granulocytes: 0 %
Lymphocytes Absolute: 1.5 10*3/uL (ref 0.7–3.1)
Lymphs: 22 %
MCH: 32.8 pg (ref 26.6–33.0)
MCHC: 34.2 g/dL (ref 31.5–35.7)
MCV: 96 fL (ref 79–97)
Monocytes Absolute: 0.6 10*3/uL (ref 0.1–0.9)
Monocytes: 9 %
Neutrophils Absolute: 4.5 10*3/uL (ref 1.4–7.0)
Neutrophils: 64 %
Platelets: 280 10*3/uL (ref 150–450)
RBC: 4.97 x10E6/uL (ref 4.14–5.80)
RDW: 11.8 % (ref 11.6–15.4)
WBC: 7 10*3/uL (ref 3.4–10.8)

## 2021-05-25 LAB — LIPID PANEL
Chol/HDL Ratio: 2.3 ratio (ref 0.0–5.0)
Cholesterol, Total: 186 mg/dL (ref 100–199)
HDL: 82 mg/dL (ref >39)
LDL Chol Calc (NIH): 91 mg/dL (ref 0–99)
Triglycerides: 73 mg/dL (ref 0–149)
VLDL Cholesterol Cal: 13 mg/dL (ref 5–40)

## 2021-05-25 LAB — TSH: TSH: 1.77 u[IU]/mL (ref 0.450–4.500)

## 2021-05-25 LAB — HEMOGLOBIN A1C
Est. average glucose Bld gHb Est-mCnc: 111 mg/dL
Hgb A1c MFr Bld: 5.5 % (ref 4.8–5.6)

## 2021-05-26 ENCOUNTER — Ambulatory Visit: Payer: PPO | Admitting: Physician Assistant

## 2021-06-06 ENCOUNTER — Ambulatory Visit (INDEPENDENT_AMBULATORY_CARE_PROVIDER_SITE_OTHER): Payer: PPO | Admitting: Physician Assistant

## 2021-06-06 ENCOUNTER — Encounter: Payer: Self-pay | Admitting: Physician Assistant

## 2021-06-06 ENCOUNTER — Other Ambulatory Visit: Payer: Self-pay

## 2021-06-06 VITALS — BP 137/85 | HR 68 | Temp 98.2°F | Ht 68.0 in | Wt 188.0 lb

## 2021-06-06 DIAGNOSIS — E039 Hypothyroidism, unspecified: Secondary | ICD-10-CM

## 2021-06-06 DIAGNOSIS — R899 Unspecified abnormal finding in specimens from other organs, systems and tissues: Secondary | ICD-10-CM

## 2021-06-06 DIAGNOSIS — E785 Hyperlipidemia, unspecified: Secondary | ICD-10-CM

## 2021-06-06 DIAGNOSIS — Z0000 Encounter for general adult medical examination without abnormal findings: Secondary | ICD-10-CM

## 2021-06-06 MED ORDER — LEVOTHYROXINE SODIUM 112 MCG PO TABS: 112.0000 ug | ORAL_TABLET | Freq: Every day | ORAL | 3 refills | Status: DC

## 2021-06-06 NOTE — Progress Notes (Signed)
Subjective:   Joshua Moreno is a 71 y.o. male who presents for Medicare Annual/Subsequent preventive examination.  Review of Systems    General:   No F/C, wt loss Pulm:   No DIB, SOB, pleuritic chest pain Card:  No CP, palpitations Abd:  No n/v/d or pain Ext:  No inc edema from baseline    Objective:    Today's Vitals   06/06/21 1525  BP: 137/85  Pulse: 68  Temp: 98.2 F (36.8 C)  SpO2: 97%  Weight: 188 lb (85.3 kg)  Height: 5\' 8"  (1.727 m)   Body mass index is 28.59 kg/m.  Advanced Directives 06/05/2019 03/13/2018 02/13/2017 01/11/2016 03/29/2015 01/06/2015  Does Patient Have a Medical Advance Directive? No Yes Yes Yes Yes Yes  Type of Advance Directive - - Faith;Living will [place];Living will [place];Living will -  Does patient want to make changes to medical advance directive? - No - Patient declined - - No - Patient declined -  Copy of [place] in Chart? - - No - copy requested Yes No - copy requested No - copy requested  Would patient like information on creating a medical advance directive? No - Patient declined - - - - -    Current Medications (verified) Outpatient Encounter Medications as of 06/06/2021  Medication Sig   Ascorbic Acid (VITAMIN C) 100 MG tablet Take 200 mg by mouth daily.   aspirin EC 81 MG tablet Take 81 mg by mouth every other day.   pyridOXINE (VITAMIN B-6) 100 MG tablet Take 100 mg by mouth daily.   rosuvastatin (CRESTOR) 10 MG tablet TAKE 1 TABLET (10 MG TOTAL) BY MOUTH DAILY.   [DISCONTINUED] levothyroxine (SYNTHROID) 112 MCG tablet TAKE 1 TABLET (112 MCG TOTAL) BY MOUTH DAILY.   levothyroxine (SYNTHROID) 112 MCG tablet Take 1 tablet (112 mcg total) by mouth daily.   No facility-administered encounter medications on file as of 06/06/2021.    Allergies (verified) Cinnamon   History: Past Medical History:  Diagnosis Date   Hyperlipidemia    Personal history  of colonic polyps-adenoma 06/26/2008   Skin cancer of face    Actinic keratoses; followed annually by [facility]   Thyroid disease    Past Surgical History:  Procedure Laterality Date   COLONOSCOPY     HYDROCELE EXCISION / REPAIR     R scrotum   POLYPECTOMY     tail bone     Family History  Problem Relation Age of Onset   Heart disease Father 30       CABG/CAD   Alzheimer's disease Mother    Colon cancer Neg Hx    Esophageal cancer Neg Hx    Rectal cancer Neg Hx    Stomach cancer Neg Hx    Colon polyps Neg Hx    Social History   Socioeconomic History   Marital status: Widowed    Spouse name: Not on file   Number of children: Not on file   Years of education: Not on file   Highest education level: Not on file  Occupational History   Occupation: retired  Tobacco Use   Smoking status: Former    Packs/day: 1.00    Years: 35.00    Pack years: 35.00    Types: Cigarettes    Quit date: 12/07/2003    Years since quitting: 17.5   Smokeless tobacco: Never  Vaping Use   Vaping Use: Never used  Substance  and Sexual Activity   Alcohol use: Yes    Alcohol/week: 16.0 standard drinks    Types: 4 Cans of beer, 6 Shots of liquor, 6 Standard drinks or equivalent per week   Drug use: No   Sexual activity: Yes    Birth control/protection: None  Other Topics Concern   Not on file  Social History Narrative   Marital status: married x 37 years widowed 12/2018 (wife had melanoma)      Children:  3 children; 3 grandchildren; no gg      Lives: with wife      Employment:  Press photographer truck tires x 40 years; 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      Tobacco: quit in 2000.  1 ppd x 30 years.      Alcohol:  Beer or bourbon daily; 5-8 drinks per week.        Exercise:  Plays tennis four days weekly. [employer].      ADLs: independent. Drives.      Advanced Directives: FULL CODE; no prolonged resuscitation.        Seatbelt: 100%; no texting while driving.           Social Determinants of  Health   Financial Resource Strain: Low Risk    Difficulty of Paying Living Expenses: Not hard at all  Food Insecurity: Not on file  Transportation Needs: No Transportation Needs   Lack of Transportation (Medical): No   Lack of Transportation (Non-Medical): No  Physical Activity: Sufficiently Active   Days of Exercise per Week: 7 days   Minutes of Exercise per Session: 120 min  Stress: Not on file  Social Connections: Not on file    Tobacco Counseling Counseling given: Not Answered   Diabetic?no   Activities of Daily Living In your present state of health, do you have any difficulty performing the following activities: 06/06/2021  Hearing? N  Vision? N  Difficulty concentrating or making decisions? N  Walking or climbing stairs? N  Dressing or bathing? N  Doing errands, shopping? N  Some recent data might be hidden    Patient Care Team: Lorrene Reid, PA-C as PCP - General (Physician Assistant) Nahser, Wonda Cheng, MD as PCP - Cardiology (Cardiology) [place], Heather, Gillis (Optometry) Harriett Sine, MD as Consulting Physician (Dermatology)  Indicate any recent Medical Services you may have received from other than [facility] providers in the past year (date may be approximate).     Assessment:   This is a routine wellness examination for Joshua Moreno.  Hearing/Vision screen No results found.  Dietary issues and exercise activities discussed: -Plays tennis for 120 minutes 6-7 days per week. -Discussed a heart healthy diet low in fat and carbohydrates.   Goals Addressed   None   Depression Screen PHQ 2/9 Scores 06/06/2021 05/26/2020 11/19/2019 05/20/2019 04/17/2018 03/13/2018 02/21/2017  PHQ - 2 Score 0 0 0 0 0 0 0  PHQ- 9 Score 0 0 0 1 0 0 -    Fall Risk Fall Risk  06/06/2021 05/26/2020 05/20/2019 04/17/2018 02/21/2017  Falls in the past year? 1 0 0 0 No  Number falls in past yr: 0 - - - -  Injury with Fall? 0 - - - -  Comment - - - - -  Risk for fall due to : No Fall Risks  - - - -  Follow up Falls evaluation completed Falls evaluation completed Falls evaluation completed - -    FALL RISK PREVENTION PERTAINING TO THE HOME:  Any stairs in  or around the home? Yes  If so, are there any without handrails? No  Home free of loose throw rugs in walkways, pet beds, electrical cords, etc? Yes  Adequate lighting in your home to reduce risk of falls? Yes   ASSISTIVE DEVICES UTILIZED TO PREVENT FALLS:  Life alert? No  Use of a cane, walker or w/c? No  Grab bars in the bathroom? Yes  Shower chair or bench in shower? Yes  Elevated toilet seat or a handicapped toilet? Yes   TIMED UP AND GO:  Was the test performed? Yes .  Length of time to ambulate 10 feet: 10 sec.   Gait steady and fast without use of assistive device  Cognitive Function: wnl's   6CIT Screen 06/06/2021 05/26/2020 05/20/2019 02/13/2017  What Year? 0 points 0 points 0 points 0 points  What month? 0 points 0 points 0 points 0 points  What time? 0 points 0 points 0 points 0 points  Count back from 20 0 points 0 points 0 points 0 points  Months in reverse 0 points 0 points 0 points 0 points  Repeat phrase 0 points 0 points 0 points 2 points  Total Score 0 0 0 2    Immunizations Immunization History  Administered Date(s) Administered   Influenza Split 05/26/2009   Influenza, High Dose Seasonal PF 04/17/2018   Influenza,inj,Quad PF,6+ Mos 01/11/2016, 02/13/2017   Influenza-Unspecified 02/14/2019, 02/21/2021   PFIZER(Purple Top)SARS-COV-2 Vaccination 06/30/2019, 07/21/2019, 04/29/2020   Pneumococcal Conjugate-13 01/06/2015   Pneumococcal Polysaccharide-23 05/26/2009, 02/13/2017   Tdap 11/30/2011, 05/14/2018   Zoster Recombinat (Shingrix) 06/24/2020   Zoster, Live 05/08/2009    TDAP status: Up to date  Flu Vaccine status: Up to date  Pneumococcal vaccine status: Up to date  Covid-19 vaccine status: Completed vaccines  Qualifies for Shingles Vaccine? Yes   Zostavax completed Yes    Shingrix Completed?: Yes  Screening Tests Health Maintenance  Topic Date Due   COVID-19 Vaccine (4 - Booster for Pfizer series) 06/24/2020   Zoster Vaccines- Shingrix (2 of 2) 08/19/2020   COLONOSCOPY (Pts 45-66yrs Insurance coverage will need to be confirmed)  03/16/2022   TETANUS/TDAP  05/14/2028   Pneumonia Vaccine 55+ Years old  Completed   INFLUENZA VACCINE  Completed   Hepatitis C Screening  Completed   HPV VACCINES  Aged Out    Health Maintenance  Health Maintenance Due  Topic Date Due   COVID-19 Vaccine (4 - Booster for [place] series) 06/24/2020   Zoster Vaccines- Shingrix (2 of 2) 08/19/2020    Colorectal cancer screening: Type of screening: Colonoscopy. Completed 03/17/2019. Repeat every 3 years  Lung Cancer Screening: (Low Dose CT Chest recommended if Age 61-80 years, 30 pack-year currently smoking OR have quit w/in 15years.) does not qualify.   Lung Cancer Screening Referral: n/a  Additional Screening:  Hepatitis C Screening: does qualify; Patient declined screening.   Vision Screening: Recommended annual ophthalmology exams for early detection of glaucoma and other disorders of the eye. Is the patient up to date with their annual eye exam?  Yes  Who is the provider or what is the name of the office in which the patient attends annual eye exams? Dr. [place] If pt is not established with a provider, would they like to be referred to a provider to establish care? No .   Dental Screening: Recommended annual dental exams for proper oral hygiene  Community Resource Referral / Chronic Care Management: CRR required this visit?  No   CCM required  this visit?  No      Plan:  -Discussed with patient most recent lab results which are essentially within normal limits or stable from prior with the exception of CMP. Pt declines excessive use of NSAIDs. Will repeat CMP in 4 weeks.  -Continue to follow up with various specialists including cardiology. -Continue  levothyroxine 112 mcg and rosuvastatin 10 mg. -Follow up in 1 year for [facility] and [place].    I have personally reviewed and noted the following in the patients chart:   Medical and social history Use of alcohol, tobacco or illicit drugs  Current medications and supplements including opioid prescriptions. Patient is not currently taking opioid prescriptions. Functional ability and status Nutritional status Physical activity Advanced directives List of other physicians Hospitalizations, surgeries, and ER visits in previous 12 months Vitals Screenings to include cognitive, depression, and falls Referrals and appointments  In addition, I have reviewed and discussed with patient certain preventive protocols, quality metrics, and best practice recommendations. A written personalized care plan for preventive services as well as general preventive health recommendations were provided to patient.     Lorrene Reid, PA-C   06/06/2021

## 2021-06-06 NOTE — Patient Instructions (Signed)
Preventive Care 65 Years and Older, Male °Preventive care refers to lifestyle choices and visits with your health care provider that can promote health and wellness. Preventive care visits are also called wellness exams. °What can I expect for my preventive care visit? °Counseling °During your preventive care visit, your health care provider may ask about your: °Medical history, including: °Past medical problems. °Family medical history. °History of falls. °Current health, including: °Emotional well-being. °Home life and relationship well-being. °Sexual activity. °Memory and ability to understand (cognition). °Lifestyle, including: °Alcohol, nicotine or tobacco, and drug use. °Access to firearms. °Diet, exercise, and sleep habits. °Work and work environment. °Sunscreen use. °Safety issues such as seatbelt and bike helmet use. °Physical exam °Your health care provider will check your: °Height and weight. These may be used to calculate your BMI (body mass index). BMI is a measurement that tells if you are at a healthy weight. °Waist circumference. This measures the distance around your waistline. This measurement also tells if you are at a healthy weight and may help predict your risk of certain diseases, such as type 2 diabetes and high blood pressure. °Heart rate and blood pressure. °Body temperature. °Skin for abnormal spots. °What immunizations do I need? °Vaccines are usually given at various ages, according to a schedule. Your health care provider will recommend vaccines for you based on your age, medical history, and lifestyle or other factors, such as travel or where you work. °What tests do I need? °Screening °Your health care provider may recommend screening tests for certain conditions. This may include: °Lipid and cholesterol levels. °Diabetes screening. This is done by checking your blood sugar (glucose) after you have not eaten for a while (fasting). °Hepatitis C test. °Hepatitis B test. °HIV (human  immunodeficiency virus) test. °STI (sexually transmitted infection) testing, if you are at risk. °Lung cancer screening. °Colorectal cancer screening. °Prostate cancer screening. °Abdominal aortic aneurysm (AAA) screening. You may need this if you are a current or former smoker. °Talk with your health care provider about your test results, treatment options, and if necessary, the need for more tests. °Follow these instructions at home: °Eating and drinking ° °Eat a diet that includes fresh fruits and vegetables, whole grains, lean protein, and low-fat dairy products. Limit your intake of foods with high amounts of sugar, saturated fats, and salt. °Take vitamin and mineral supplements as recommended by your health care provider. °Do not drink alcohol if your health care provider tells you not to drink. °If you drink alcohol: °Limit how much you have to 0-2 drinks a day. °Know how much alcohol is in your drink. In the [place] one drink equals one 12 oz bottle of beer (355 mL), one 5 oz glass of wine (148 mL), or one 1½ oz glass of hard liquor (44 mL). °Lifestyle °Brush your teeth every morning and night with fluoride toothpaste. Floss one time each day. °Exercise for at least 30 minutes 5 or more days each week. °Do not use any products that contain nicotine or tobacco. These products include cigarettes, chewing tobacco, and vaping devices, such as e-cigarettes. If you need help quitting, ask your health care provider. °Do not use drugs. °If you are sexually active, practice safe sex. Use a condom or other form of protection to prevent STIs. °Take aspirin only as told by your health care provider. Make sure that you understand how much to take and what form to take. Work with your health care provider to find out whether it is safe and   beneficial for you to take aspirin daily. °Ask your health care provider if you need to take a cholesterol-lowering medicine (statin). °Find healthy ways to manage stress, such  as: °Meditation, yoga, or listening to music. °Journaling. °Talking to a trusted person. °Spending time with friends and family. °Safety °Always wear your seat belt while driving or riding in a vehicle. °Do not drive: °If you have been drinking alcohol. Do not ride with someone who has been drinking. °When you are tired or distracted. °While texting. °If you have been using any mind-altering substances or drugs. °Wear a helmet and other protective equipment during sports activities. °If you have firearms in your house, make sure you follow all gun safety procedures. °Minimize exposure to UV radiation to reduce your risk of skin cancer. °What's next? °Visit your health care provider once a year for an annual wellness visit. °Ask your health care provider how often you should have your eyes and teeth checked. °Stay up to date on all vaccines. °This information is not intended to replace advice given to you by your health care provider. Make sure you discuss any questions you have with your health care provider. °Document Revised: 10/20/2020 Document Reviewed: 10/20/2020 °Elsevier Patient Education © 2022 [employer] ° °

## 2021-07-05 ENCOUNTER — Other Ambulatory Visit: Payer: Self-pay

## 2021-07-05 ENCOUNTER — Other Ambulatory Visit: Payer: PPO

## 2021-07-05 DIAGNOSIS — E039 Hypothyroidism, unspecified: Secondary | ICD-10-CM

## 2021-07-05 DIAGNOSIS — E663 Overweight: Secondary | ICD-10-CM

## 2021-07-05 DIAGNOSIS — E785 Hyperlipidemia, unspecified: Secondary | ICD-10-CM | POA: Diagnosis not present

## 2021-07-05 DIAGNOSIS — Z0000 Encounter for general adult medical examination without abnormal findings: Secondary | ICD-10-CM | POA: Diagnosis not present

## 2021-07-06 LAB — COMPREHENSIVE METABOLIC PANEL
ALT: 58 IU/L — ABNORMAL HIGH (ref 0–44)
AST: 48 IU/L — ABNORMAL HIGH (ref 0–40)
Albumin/Globulin Ratio: 1.9 (ref 1.2–2.2)
Albumin: 4.3 g/dL (ref 3.7–4.7)
Alkaline Phosphatase: 103 IU/L (ref 44–121)
BUN/Creatinine Ratio: 13 (ref 10–24)
BUN: 15 mg/dL (ref 8–27)
Bilirubin Total: 0.9 mg/dL (ref 0.0–1.2)
CO2: 23 mmol/L (ref 20–29)
Calcium: 9.9 mg/dL (ref 8.6–10.2)
Chloride: 104 mmol/L (ref 96–106)
Creatinine, Ser: 1.15 mg/dL (ref 0.76–1.27)
Globulin, Total: 2.3 g/dL (ref 1.5–4.5)
Glucose: 110 mg/dL — ABNORMAL HIGH (ref 70–99)
Potassium: 4.5 mmol/L (ref 3.5–5.2)
Sodium: 142 mmol/L (ref 134–144)
Total Protein: 6.6 g/dL (ref 6.0–8.5)
eGFR: 68 mL/min/{1.73_m2} (ref >59)

## 2021-07-06 LAB — LIPID PANEL
Chol/HDL Ratio: 2.3 ratio (ref 0.0–5.0)
Cholesterol, Total: 209 mg/dL — ABNORMAL HIGH (ref 100–199)
HDL: 92 mg/dL (ref >39)
LDL Chol Calc (NIH): 104 mg/dL — ABNORMAL HIGH (ref 0–99)
Triglycerides: 74 mg/dL (ref 0–149)
VLDL Cholesterol Cal: 13 mg/dL (ref 5–40)

## 2021-07-06 LAB — HEMOGLOBIN A1C
Est. average glucose Bld gHb Est-mCnc: 111 mg/dL
Hgb A1c MFr Bld: 5.5 % (ref 4.8–5.6)

## 2021-07-06 LAB — CBC
Hematocrit: 47.6 % (ref 37.5–51.0)
Hemoglobin: 16.4 g/dL (ref 13.0–17.7)
MCH: 32.9 pg (ref 26.6–33.0)
MCHC: 34.5 g/dL (ref 31.5–35.7)
MCV: 96 fL (ref 79–97)
Platelets: 270 10*3/uL (ref 150–450)
RBC: 4.98 x10E6/uL (ref 4.14–5.80)
RDW: 12.1 % (ref 11.6–15.4)
WBC: 8.5 10*3/uL (ref 3.4–10.8)

## 2021-07-06 LAB — TSH: TSH: 1.8 u[IU]/mL (ref 0.450–4.500)

## 2021-09-06 ENCOUNTER — Other Ambulatory Visit: Payer: PPO

## 2021-09-06 DIAGNOSIS — E785 Hyperlipidemia, unspecified: Secondary | ICD-10-CM | POA: Diagnosis not present

## 2021-09-07 LAB — COMPREHENSIVE METABOLIC PANEL
ALT: 41 IU/L (ref 0–44)
AST: 44 IU/L — ABNORMAL HIGH (ref 0–40)
Albumin/Globulin Ratio: 1.6 (ref 1.2–2.2)
Albumin: 3.9 g/dL (ref 3.7–4.7)
Alkaline Phosphatase: 93 IU/L (ref 44–121)
BUN/Creatinine Ratio: 12 (ref 10–24)
BUN: 12 mg/dL (ref 8–27)
Bilirubin Total: 0.6 mg/dL (ref 0.0–1.2)
CO2: 24 mmol/L (ref 20–29)
Calcium: 9.5 mg/dL (ref 8.6–10.2)
Chloride: 103 mmol/L (ref 96–106)
Creatinine, Ser: 0.99 mg/dL (ref 0.76–1.27)
Globulin, Total: 2.5 g/dL (ref 1.5–4.5)
Glucose: 79 mg/dL (ref 70–99)
Potassium: 4.6 mmol/L (ref 3.5–5.2)
Sodium: 141 mmol/L (ref 134–144)
Total Protein: 6.4 g/dL (ref 6.0–8.5)
eGFR: 81 mL/min/{1.73_m2} (ref >59)

## 2021-09-19 DIAGNOSIS — L570 Actinic keratosis: Secondary | ICD-10-CM | POA: Diagnosis not present

## 2021-09-19 DIAGNOSIS — L814 Other melanin hyperpigmentation: Secondary | ICD-10-CM | POA: Diagnosis not present

## 2021-09-19 DIAGNOSIS — Z85820 Personal history of malignant melanoma of skin: Secondary | ICD-10-CM | POA: Diagnosis not present

## 2021-09-19 DIAGNOSIS — L821 Other seborrheic keratosis: Secondary | ICD-10-CM | POA: Diagnosis not present

## 2021-09-19 DIAGNOSIS — D1801 Hemangioma of skin and subcutaneous tissue: Secondary | ICD-10-CM | POA: Diagnosis not present

## 2021-09-19 DIAGNOSIS — D225 Melanocytic nevi of trunk: Secondary | ICD-10-CM | POA: Diagnosis not present

## 2021-09-19 DIAGNOSIS — Z85828 Personal history of other malignant neoplasm of skin: Secondary | ICD-10-CM | POA: Diagnosis not present

## 2021-09-28 DIAGNOSIS — H35371 Puckering of macula, right eye: Secondary | ICD-10-CM | POA: Diagnosis not present

## 2021-11-11 ENCOUNTER — Other Ambulatory Visit: Payer: Self-pay | Admitting: Cardiovascular Disease

## 2021-11-18 ENCOUNTER — Encounter: Payer: Self-pay | Admitting: Cardiovascular Disease

## 2021-11-18 NOTE — Progress Notes (Unsigned)
Cardiology Office Note:    Date:  11/22/2021   ID:  Joshua Moreno, DOB 15-Nov-1950, MRN [id number]  PCP:  Lorrene Reid, PA-C  Cardiologist:  Mertie Moores, MD  Electrophysiologist:  None   Referring MD: Lorrene Reid, PA-C   1.  Problem list 1.  Coronary artery calcifications 2.  Right bundle branch block 3.  Hyperlipidemia 4.  Hypothyroidism  Chief Complaint  Patient presents with   Hyperlipidemia     Feb. 11, 2020    Joshua Moreno is a 71 y.o. male with a hx of hyperlipidemia and right bundle branch block.  He recently had a CT of the chest for cancer screening and was found to have coronary artery calcifications.   Denies any chest pain or shortness of breath.  He plays competitive tennis 4 times a week.  Can play for 2 hours a day in the summer .    Retired in the Control and instrumentation engineer business.    Lots of volunteer work at [employer] No syncope  Has been on atorvastatin for 10 years . Lipids look okay.  His LDL is 103.  Total cholesterol is 187.  HDL is 67.  Triglyceride level is 87.  July 10, 2019  Joshua Moreno is seen back today for follow-up of his hyperlipidemia and right bundle branch block. Wife passed away last summer.  No CP or dyspnea Still plays tennis 6 day a week .    November 18, 2020; Joshua Moreno is seen back today for his HLD and RBBB Still playing tennis  No Cp , no dyspnea  Still eating some salt -  BP at home is in the 130s   November 22, 2021 Joshua Moreno is seen today for follow up of his HLD and RBBB BP is mildly elevated Does not eat many hot dogs      Past Medical History:  Diagnosis Date   Hyperlipidemia    Personal history of colonic polyps-adenoma 06/26/2008   Skin cancer of face    Actinic keratoses; followed annually by [facility]   Thyroid disease     Past Surgical History:  Procedure Laterality Date   COLONOSCOPY     HYDROCELE EXCISION / REPAIR     R scrotum   POLYPECTOMY     tail bone      Current Medications: Current  Meds  Medication Sig   Ascorbic Acid (VITAMIN C) 100 MG tablet Take 200 mg by mouth daily.   hydrochlorothiazide (HYDRODIURIL) 25 MG tablet Take 1 tablet (25 mg total) by mouth daily.   levothyroxine (SYNTHROID) 112 MCG tablet Take 1 tablet (112 mcg total) by mouth daily.   losartan (COZAAR) 50 MG tablet Take 1 tablet (50 mg total) by mouth daily.   potassium chloride (KLOR-CON) 10 MEQ tablet Take 1 tablet (10 mEq total) by mouth 2 (two) times daily.   pyridOXINE (VITAMIN B-6) 100 MG tablet Take 100 mg by mouth daily.   rosuvastatin (CRESTOR) 10 MG tablet TAKE 1 TABLET BY MOUTH DAILY.     Allergies:   Patient has no active allergies.   Social History   Socioeconomic History   Marital status: Widowed    Spouse name: Not on file   Number of children: Not on file   Years of education: Not on file   Highest education level: Not on file  Occupational History   Occupation: retired  Tobacco Use   Smoking status: Former    Packs/day: 1.00    Years: 35.00    Total  pack years: 35.00    Types: Cigarettes    Quit date: 12/07/2003    Years since quitting: 17.9   Smokeless tobacco: Never  Vaping Use   Vaping Use: Never used  Substance and Sexual Activity   Alcohol use: Yes    Alcohol/week: 16.0 standard drinks of alcohol    Types: 4 Cans of beer, 6 Shots of liquor, 6 Standard drinks or equivalent per week   Drug use: No   Sexual activity: Yes    Birth control/protection: None  Other Topics Concern   Not on file  Social History Narrative   Marital status: married x 37 years widowed 12/2018 (wife had melanoma)      Children:  3 children; 3 grandchildren; no gg      Lives: with wife      Employment:  Press photographer truck tires x 40 years; 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      Tobacco: quit in 2000.  1 ppd x 30 years.      Alcohol:  Beer or bourbon daily; 5-8 drinks per week.        Exercise:  Plays tennis four days weekly. [employer].      ADLs: independent. Drives.      Advanced Directives: FULL  CODE; no prolonged resuscitation.        Seatbelt: 100%; no texting while driving.           Social Determinants of Health   Financial Resource Strain: Low Risk  (06/06/2021)   Overall Financial Resource Strain (CARDIA)    Difficulty of Paying Living Expenses: Not hard at all  Food Insecurity: Not on file  Transportation Needs: No Transportation Needs (06/06/2021)   PRAPARE - Hydrologist (Medical): No    Lack of Transportation (Non-Medical): No  Physical Activity: Sufficiently Active (06/06/2021)   Exercise Vital Sign    Days of Exercise per Week: 7 days    Minutes of Exercise per Session: 120 min  Stress: Not on file  Social Connections: Not on file     Family History: The patient's family history includes Alzheimer's disease in his mother; Heart disease (age of onset: 65) in his father. There is no history of Colon cancer, Esophageal cancer, Rectal cancer, Stomach cancer, or Colon polyps.    ROS:   Please see the history of present illness.     All other systems reviewed and are negative.  EKGs/Labs/Other Studies Reviewed:    The following studies were reviewed today:   EKG:    November 22, 2021: Sinus bradycardia 50.  Right bundle branch block.  Minimal voltage criteria for LVH.  Recent Labs: 07/05/2021: Hemoglobin 16.4; Platelets 270; TSH 1.800 09/06/2021: ALT 41; BUN 12; Creatinine, Ser 0.99; Potassium 4.6; Sodium 141  Recent Lipid Panel    Component Value Date/Time   CHOL 209 (H) 07/05/2021 1048   TRIG 74 07/05/2021 1048   HDL 92 07/05/2021 1048   CHOLHDL 2.3 07/05/2021 1048   CHOLHDL 2.1 01/11/2016 0819   VLDL 17 01/11/2016 0819   LDLCALC 104 (H) 07/05/2021 1048    Physical Exam:    Physical Exam: Blood pressure (!) 152/78, pulse (!) 50, height '5\' 8"'$  (1.727 m), weight 187 lb 9.6 oz (85.1 kg), SpO2 99 %.  GEN:  Well nourished, well developed in no acute distress HEENT: Normal NECK: No JVD; No carotid bruits LYMPHATICS: No  lymphadenopathy CARDIAC: RRR , no murmurs, rubs, gallops RESPIRATORY:  Clear to auscultation without rales, wheezing or rhonchi  ABDOMEN: Soft, non-tender, non-distended MUSCULOSKELETAL:  trace leg edema  No deformity  SKIN: Warm and dry NEUROLOGIC:  Alert and oriented x 3     ASSESSMENT:    1. Mixed hyperlipidemia   2. Primary hypertension   3. RBBB   4. Medication management     PLAN:        Coronary Artery disease:    no angina    2.  Right bundle branch block: stable    3.  Hyperlipidemia - check labs today , cont   4. HTN:   BP is running a bit high Add Losartan 50 mg a day  ,HCTZ 25 mg a day, Kdur 10 meq BID   BMP in 3 weeks   I have advised him to get an Omron blood pressure cuff.  He will take readings periodically and send them in via MyChart.     Medication Adjustments/Labs and Tests Ordered: Current medicines are reviewed at length with the patient today.  Concerns regarding medicines are outlined above.  Orders Placed This Encounter  Procedures   Lipid panel   ALT   Basic metabolic panel   Basic metabolic panel   EKG [id number]    Meds ordered this encounter  Medications   losartan (COZAAR) 50 MG tablet    Sig: Take 1 tablet (50 mg total) by mouth daily.    Dispense:  90 tablet    Refill:  3   hydrochlorothiazide (HYDRODIURIL) 25 MG tablet    Sig: Take 1 tablet (25 mg total) by mouth daily.    Dispense:  90 tablet    Refill:  3   potassium chloride (KLOR-CON) 10 MEQ tablet    Sig: Take 1 tablet (10 mEq total) by mouth 2 (two) times daily.    Dispense:  180 tablet    Refill:  3    Patient Instructions  Omron BP cuff-please send your BP log to [place] via MyChart in 7-10 days  Medication Instructions:  START Losartan '50mg'$  daily START Hydrochlorothiazide '25mg'$  daily START Potassium 83mq twice daily *If you need a refill on your cardiac medications before your next appointment, please call your pharmacy*   Lab Work: BMET, ALT, [place] in 3 weeks  If you have labs (blood work) drawn today and your tests are completely normal, you will receive your results only by: [place](if you have MyChart) OR A paper copy in the mail If you have any lab test that is abnormal or we need to change your treatment, we will call you to review the results.   Testing/Procedures: NONE   Follow-Up: [facility] and your [facility] needs are our [facility].  As part of our continuing mission to provide you with exceptional heart care, we have created designated Provider Care Teams.  These Care Teams include your primary Cardiologist (physician) and Advanced Practice Providers (APPs -  Physician Assistants and Nurse Practitioners) who all work together to provide you with the care you need, when you need it.  Your next appointment:   1 year(s)  The format for your next appointment:   In Person  Provider:   SRonn Melena or Kani Chauvin {     Important Information About Sugar          Signed, PMertie Moores MD  11/22/2021 1:24 PM    [place]

## 2021-11-22 ENCOUNTER — Ambulatory Visit: Payer: PPO | Admitting: Cardiovascular Disease

## 2021-11-22 ENCOUNTER — Encounter: Payer: Self-pay | Admitting: Cardiovascular Disease

## 2021-11-22 VITALS — BP 152/78 | HR 50 | Ht 68.0 in | Wt 187.6 lb

## 2021-11-22 DIAGNOSIS — I4510 Unspecified right bundle-branch block: Secondary | ICD-10-CM | POA: Diagnosis not present

## 2021-11-22 DIAGNOSIS — Z79899 Other long term (current) drug therapy: Secondary | ICD-10-CM | POA: Diagnosis not present

## 2021-11-22 DIAGNOSIS — E782 Mixed hyperlipidemia: Secondary | ICD-10-CM

## 2021-11-22 DIAGNOSIS — I10 Essential (primary) hypertension: Secondary | ICD-10-CM | POA: Diagnosis not present

## 2021-11-22 MED ORDER — LOSARTAN POTASSIUM 50 MG PO TABS: 50.0000 mg | ORAL_TABLET | Freq: Every day | ORAL | 3 refills | Status: DC

## 2021-11-22 MED ORDER — POTASSIUM CHLORIDE ER 10 MEQ PO TBCR: 10.0000 meq | EXTENDED_RELEASE_TABLET | Freq: Two times a day (BID) | ORAL | 3 refills | Status: DC

## 2021-11-22 MED ORDER — HYDROCHLOROTHIAZIDE 25 MG PO TABS: 25.0000 mg | ORAL_TABLET | Freq: Every day | ORAL | 3 refills | Status: DC

## 2021-11-22 NOTE — Patient Instructions (Addendum)
Omron BP cuff-please send your BP log to [place] via MyChart in 7-10 days  Medication Instructions:  START Losartan '50mg'$  daily START Hydrochlorothiazide '25mg'$  daily START Potassium 74mq twice daily *If you need a refill on your cardiac medications before your next appointment, please call your pharmacy*   Lab Work: BMET, ALT, [place] in 3 weeks  If you have labs (blood work) drawn today and your tests are completely normal, you will receive your results only by: [place](if you have MyChart) OR A paper copy in the mail If you have any lab test that is abnormal or we need to change your treatment, we will call you to review the results.   Testing/Procedures: NONE   Follow-Up: [facility].  As part of our continuing mission to provide you with exceptional heart care, we have created designated Provider Care Teams.  These Care Teams include your primary Cardiologist (physician) and Advanced Practice Providers (APPs -  Physician Assistants and Nurse Practitioners) who all work together to provide you with the care you need, when you need it.  Your next appointment:   1 year(s)  The format for your next appointment:   In Person  Provider:   SRonn Melena or Nahser {     Important Information About Sugar

## 2021-11-23 LAB — BASIC METABOLIC PANEL
BUN/Creatinine Ratio: 12 (ref 10–24)
BUN: 14 mg/dL (ref 8–27)
CO2: 24 mmol/L (ref 20–29)
Calcium: 9.3 mg/dL (ref 8.6–10.2)
Chloride: 105 mmol/L (ref 96–106)
Creatinine, Ser: 1.2 mg/dL (ref 0.76–1.27)
Glucose: 89 mg/dL (ref 70–99)
Potassium: 4.4 mmol/L (ref 3.5–5.2)
Sodium: 141 mmol/L (ref 134–144)
eGFR: 64 mL/min/{1.73_m2} (ref >59)

## 2021-11-23 LAB — LIPID PANEL
Chol/HDL Ratio: 2.9 ratio (ref 0.0–5.0)
Cholesterol, Total: 174 mg/dL (ref 100–199)
HDL: 61 mg/dL (ref >39)
LDL Chol Calc (NIH): 97 mg/dL (ref 0–99)
Triglycerides: 89 mg/dL (ref 0–149)
VLDL Cholesterol Cal: 16 mg/dL (ref 5–40)

## 2021-11-23 LAB — ALT: ALT: 30 IU/L (ref 0–44)

## 2021-12-06 ENCOUNTER — Telehealth: Payer: Self-pay | Admitting: Cardiovascular Disease

## 2021-12-06 NOTE — Telephone Encounter (Signed)
Pt c/o medication issue:  1. Name of Medication:   losartan (COZAAR) 50 MG tablet    hydrochlorothiazide (HYDRODIURIL) 25 MG tablet   2. How are you currently taking this medication (dosage and times per day)?   3. Are you having a reaction (difficulty breathing--STAT)? No  4. What is your medication issue? Pt has questions regarding medications. Pt would lika callback. Please advise

## 2021-12-06 NOTE — Telephone Encounter (Signed)
Returned call to patient, went to voicemail, left message for pt to call back or send MyChart message.

## 2021-12-09 ENCOUNTER — Encounter: Payer: Self-pay | Admitting: Cardiovascular Disease

## 2021-12-09 NOTE — Telephone Encounter (Signed)
Pt sent myChart message into MD and spoke with him regarding blood work and BP medications. Will close this encounter.

## 2021-12-13 ENCOUNTER — Other Ambulatory Visit: Payer: PPO

## 2021-12-20 NOTE — Telephone Encounter (Signed)
Nahser, Wonda Cheng, MD  [place] K Overall BP is much better  Continue current meds.  Continue to work on diet, exercise  Lets set up an office visit to evaluate and discuss if we need to increase the Losartan   PN   Called and spoke with patient. He is not taking any BP medication currently, as was mentioned in the initial MyChart message. Advised patient that with the supplied BP readings, he can continue with diet and exercise and avoiding sodium. He understands to call us if needed, but he will remain off of medication at this time. Will keep yearly follow-up at this time.

## 2021-12-27 ENCOUNTER — Other Ambulatory Visit: Payer: PPO

## 2022-02-13 ENCOUNTER — Other Ambulatory Visit: Payer: Self-pay | Admitting: Cardiovascular Disease

## 2022-02-27 ENCOUNTER — Encounter: Payer: Self-pay | Admitting: Internal Medicine

## 2022-03-05 ENCOUNTER — Ambulatory Visit
Admission: EM | Admit: 2022-03-05 | Discharge: 2022-03-05 | Disposition: A | Discharge status: Home / Self Care | Payer: PPO | Attending: Family Medicine | Admitting: Family Medicine

## 2022-03-05 ENCOUNTER — Encounter: Payer: Self-pay | Admitting: Emergency Medicine

## 2022-03-05 DIAGNOSIS — U071 COVID-19: Secondary | ICD-10-CM | POA: Diagnosis not present

## 2022-03-05 DIAGNOSIS — R062 Wheezing: Secondary | ICD-10-CM

## 2022-03-05 MED ORDER — PREDNISONE 20 MG PO TABS
40.0000 mg | ORAL_TABLET | Freq: Every day | ORAL | 0 refills | Status: DC
Start: 2022-03-05 — End: 2022-04-03

## 2022-03-05 MED ORDER — HYDROCOD POLI-CHLORPHE POLI ER 10-8 MG/5ML PO SUER: 5.0000 mL | Freq: Two times a day (BID) | ORAL | 0 refills | Status: DC | PRN

## 2022-03-05 MED ORDER — ACETAMINOPHEN 325 MG PO TABS
650.0000 mg | ORAL_TABLET | Freq: Once | ORAL | Status: AC
  Administered 2022-03-05: 650 mg via ORAL

## 2022-03-05 MED ORDER — PAXLOVID (300/100) 20 X 150 MG & 10 X 100MG PO TBPK: ORAL_TABLET | ORAL | 0 refills | Status: DC

## 2022-03-05 NOTE — ED Provider Notes (Signed)
Joshua Moreno CARE    CSN: [id number] Arrival date & time: 03/05/22  1106      History   Chief Complaint Chief Complaint  Patient presents with   COVID Positive    HPI Joshua Moreno is a 71 y.o. male.   HPI  Patient started feeling sick yesterday.  Did a COVID test and it was positive.  Is here with body aches cough congestion and sore throat.  Wants advice regarding treatment, quarantine Past Medical History:  Diagnosis Date   Hyperlipidemia    Personal history of colonic polyps-adenoma 06/26/2008   Skin cancer of face    Actinic keratoses; followed [facility] by [facility]   Thyroid disease     Patient Active Problem List   Diagnosis Date Noted   Neck pain 05/20/2019   Murmur, cardiac 04/17/2018   Healthcare maintenance 03/13/2018   Hyperlipidemia 12/04/2011   Hypothyroidism 12/04/2011   RBBB 12/04/2011   Overweight (BMI 25.0-29.9) 12/04/2011   History of tobacco use 12/04/2011   History of colonic polyps 06/26/2008    Past Surgical History:  Procedure Laterality Date   COLONOSCOPY     HYDROCELE EXCISION / REPAIR     R scrotum   POLYPECTOMY     tail bone         Home Medications    Prior to Admission medications   Medication Sig Start Date End Date Taking? Authorizing Provider  Ascorbic Acid (VITAMIN C) 100 MG tablet Take 200 mg by mouth daily.   Yes [provider]  chlorpheniramine-HYDROcodone (TUSSIONEX) 10-8 MG/5ML Take 5 mLs by mouth every 12 (twelve) hours as needed for cough. 03/05/22  Yes Raylene Everts, MD  hydrochlorothiazide (HYDRODIURIL) 25 MG tablet Take 1 tablet (25 mg total) by mouth daily. 11/22/21  Yes Nahser, Wonda Cheng, MD  levothyroxine (SYNTHROID) 112 MCG tablet Take 1 tablet (112 mcg total) by mouth daily. 06/06/21  Yes Abonza, Maritza, PA-C  losartan (COZAAR) 50 MG tablet Take 1 tablet (50 mg total) by mouth daily. 11/22/21  Yes Nahser, Wonda Cheng, MD  nirmatrelvir & ritonavir (PAXLOVID, 300/100,) 20 x 150  MG & 10 x '100MG'$  TBPK Take as directed 03/05/22  Yes Raylene Everts, MD  potassium chloride (KLOR-CON) 10 MEQ tablet Take 1 tablet (10 mEq total) by mouth 2 (two) times daily. 11/22/21  Yes Nahser, Wonda Cheng, MD  predniSONE (DELTASONE) 20 MG tablet Take 2 tablets (40 mg total) by mouth daily with breakfast. 03/05/22  Yes Raylene Everts, MD  pyridOXINE (VITAMIN B-6) 100 MG tablet Take 100 mg by mouth daily.   Yes [provider]  rosuvastatin (CRESTOR) 10 MG tablet TAKE 1 TABLET BY MOUTH DAILY. 02/13/22  Yes Nahser, Wonda Cheng, MD    Family History Family History  Problem Relation Age of Onset   Heart disease Father 12       CABG/CAD   Alzheimer's disease Mother    Colon cancer Neg Hx    Esophageal cancer Neg Hx    Rectal cancer Neg Hx    Stomach cancer Neg Hx    Colon polyps Neg Hx     Social History Social History   Tobacco Use   Smoking status: Former    Packs/day: 1.00    Years: 35.00    Total pack years: 35.00    Types: Cigarettes    Quit date: 12/07/2003    Years since quitting: 18.2   Smokeless tobacco: Never  Vaping Use   Vaping Use: Never used  Substance Use Topics   Alcohol use: Yes    Alcohol/week: 16.0 standard drinks of alcohol    Types: 4 Cans of beer, 6 Shots of liquor, 6 Standard drinks or equivalent per week   Drug use: No     Allergies   Patient has no known allergies.   Review of Systems Review of Systems See HPI  Physical Exam Triage Vital Signs ED Triage Vitals  Enc Vitals Group     BP 03/05/22 1144 (!) 161/82     Pulse Rate 03/05/22 1144 62     Resp 03/05/22 1144 20     Temp 03/05/22 1144 (!) 100.4 F (38 C)     Temp Source 03/05/22 1144 Oral     SpO2 03/05/22 1144 97 %     Weight --      Height --      Head Circumference --      Peak Flow --      Pain Score 03/05/22 1146 0     Pain Loc --      Pain Edu? --      Excl. in [place]? --    No data found.  Updated Vital Signs BP (!) 161/82 (BP Location: Right Arm)   Pulse  62   Temp (!) 100.4 F (38 C) (Oral)   Resp 20   SpO2 97%      Physical Exam Constitutional:      General: He is not in acute distress.    Appearance: He is well-developed. He is ill-appearing.  HENT:     Head: Normocephalic and atraumatic.  Eyes:     Conjunctiva/sclera: Conjunctivae normal.     Pupils: Pupils are equal, round, and reactive to light.  Cardiovascular:     Rate and Rhythm: Normal rate and regular rhythm.     Heart sounds: Normal heart sounds.  Pulmonary:     Effort: Pulmonary effort is normal. No respiratory distress.     Breath sounds: Normal breath sounds. No wheezing, rhonchi or rales.  Abdominal:     General: There is no distension.     Palpations: Abdomen is soft.  Musculoskeletal:        General: Normal range of motion.     Cervical back: Normal range of motion.  Skin:    General: Skin is warm and dry.  Neurological:     Mental Status: He is alert.     Gait: Gait normal.  Psychiatric:        Mood and Affect: Mood normal.        Behavior: Behavior normal.      UC Treatments / Results  Labs (all labs ordered are listed, but only abnormal results are displayed) Labs Reviewed - No data to display  EKG   Radiology No results found.  Procedures Procedures (including critical care time)  Medications Ordered in UC Medications  acetaminophen (TYLENOL) tablet 650 mg (650 mg Oral Given 03/05/22 1152)    Initial Impression / Assessment and Plan / UC Course  I have reviewed the triage vital signs and the nursing notes.  Pertinent labs & imaging results that were available during my care of the patient were reviewed by me and considered in my medical decision making (see chart for details).     Final Clinical Impressions(s) / UC Diagnoses   Final diagnoses:  COVID-19  Wheezing     Discharge Instructions      You must stay home and quarantine.  Wear a mask for 10  full days Take the Paxlovid 2 times a day for 5 days. Do not take your  Crestor for 5 days while on Paxlovid Drink lots of water I have prescribed Tussionex for severe cough Take prednisone once a day for 5 days If you get worse instead of better, go to the closest emergency room May take over-the-counter cough and cold medicines, Tylenol or ibuprofen if needed   ED Prescriptions     Medication Sig Dispense Auth. Provider   nirmatrelvir & ritonavir (PAXLOVID, 300/100,) 20 x 150 MG & 10 x '100MG'$  TBPK Take as directed 1 each Raylene Everts, MD   chlorpheniramine-HYDROcodone (TUSSIONEX) 10-8 MG/5ML Take 5 mLs by mouth every 12 (twelve) hours as needed for cough. 115 mL Raylene Everts, MD   predniSONE (DELTASONE) 20 MG tablet Take 2 tablets (40 mg total) by mouth daily with breakfast. 10 tablet Raylene Everts, MD      PDMP not reviewed this encounter.   Raylene Everts, MD 03/05/22 [phone]

## 2022-03-05 NOTE — Discharge Instructions (Signed)
You must stay home and quarantine.  Wear a mask for 10 full days Take the Paxlovid 2 times a day for 5 days. Do not take your Crestor for 5 days while on Paxlovid Drink lots of water I have prescribed Tussionex for severe cough Take prednisone once a day for 5 days If you get worse instead of better, go to the closest emergency room May take over-the-counter cough and cold medicines, Tylenol or ibuprofen if needed

## 2022-03-05 NOTE — ED Triage Notes (Signed)
Patient c/o cough, congestion, sore throat and tested positive for COVID last night at home.  Patient has been taken Gabapentin.

## 2022-03-06 ENCOUNTER — Telehealth: Payer: Self-pay | Admitting: Emergency Medicine

## 2022-03-17 ENCOUNTER — Ambulatory Visit
Admission: EM | Admit: 2022-03-17 | Discharge: 2022-03-17 | Disposition: A | Discharge status: Home / Self Care | Payer: PPO

## 2022-03-17 DIAGNOSIS — S63259A Unspecified dislocation of unspecified finger, initial encounter: Secondary | ICD-10-CM

## 2022-03-17 DIAGNOSIS — S63283A Dislocation of proximal interphalangeal joint of left middle finger, initial encounter: Secondary | ICD-10-CM | POA: Diagnosis not present

## 2022-03-17 DIAGNOSIS — M79645 Pain in left finger(s): Secondary | ICD-10-CM | POA: Diagnosis not present

## 2022-03-17 NOTE — ED Provider Notes (Signed)
Patient here today for evaluation of injury while playing tennis earlier this morning. He has notable deformed/ displaced finger to left hand. Recommended further evaluation by ortho- [facility]. Patient is agreeable to same.    Francene Finders, PA-C 03/17/22 [phone]

## 2022-03-17 NOTE — ED Notes (Signed)
[facility] referred to [facility].

## 2022-03-17 NOTE — ED Triage Notes (Signed)
Pt presents to uc with co of l hand injury this morning while playing tennis. Pt reports slipping while trying to hit the ball. Pt middle finger is crooked on examination and small knick to knuckles bleeding under control.

## 2022-03-24 DIAGNOSIS — S63283A Dislocation of proximal interphalangeal joint of left middle finger, initial encounter: Secondary | ICD-10-CM | POA: Diagnosis not present

## 2022-03-24 DIAGNOSIS — M79645 Pain in left finger(s): Secondary | ICD-10-CM | POA: Diagnosis not present

## 2022-03-27 DIAGNOSIS — L821 Other seborrheic keratosis: Secondary | ICD-10-CM | POA: Diagnosis not present

## 2022-03-27 DIAGNOSIS — L570 Actinic keratosis: Secondary | ICD-10-CM | POA: Diagnosis not present

## 2022-03-27 DIAGNOSIS — Z85828 Personal history of other malignant neoplasm of skin: Secondary | ICD-10-CM | POA: Diagnosis not present

## 2022-03-27 DIAGNOSIS — L814 Other melanin hyperpigmentation: Secondary | ICD-10-CM | POA: Diagnosis not present

## 2022-03-27 DIAGNOSIS — M79645 Pain in left finger(s): Secondary | ICD-10-CM | POA: Diagnosis not present

## 2022-03-27 DIAGNOSIS — D1801 Hemangioma of skin and subcutaneous tissue: Secondary | ICD-10-CM | POA: Diagnosis not present

## 2022-04-03 ENCOUNTER — Ambulatory Visit (AMBULATORY_SURGERY_CENTER): Payer: Self-pay

## 2022-04-03 VITALS — Ht 68.0 in | Wt 188.0 lb

## 2022-04-03 DIAGNOSIS — Z86010 Personal history of colonic polyps: Secondary | ICD-10-CM

## 2022-04-03 NOTE — Progress Notes (Signed)

## 2022-04-11 DIAGNOSIS — M79645 Pain in left finger(s): Secondary | ICD-10-CM | POA: Diagnosis not present

## 2022-04-17 DIAGNOSIS — M79645 Pain in left finger(s): Secondary | ICD-10-CM | POA: Diagnosis not present

## 2022-04-18 DIAGNOSIS — M79645 Pain in left finger(s): Secondary | ICD-10-CM | POA: Diagnosis not present

## 2022-04-18 DIAGNOSIS — S63283A Dislocation of proximal interphalangeal joint of left middle finger, initial encounter: Secondary | ICD-10-CM | POA: Diagnosis not present

## 2022-04-19 ENCOUNTER — Encounter: Payer: PPO | Admitting: Internal Medicine

## 2022-04-26 DIAGNOSIS — M79645 Pain in left finger(s): Secondary | ICD-10-CM | POA: Diagnosis not present

## 2022-05-03 ENCOUNTER — Ambulatory Visit (AMBULATORY_SURGERY_CENTER): Payer: PPO | Admitting: Internal Medicine

## 2022-05-03 ENCOUNTER — Encounter: Payer: Self-pay | Admitting: Internal Medicine

## 2022-05-03 VITALS — BP 126/62 | HR 46 | Temp 98.6°F | Resp 12 | Ht 68.0 in | Wt 188.0 lb

## 2022-05-03 DIAGNOSIS — D125 Benign neoplasm of sigmoid colon: Secondary | ICD-10-CM | POA: Diagnosis not present

## 2022-05-03 DIAGNOSIS — Z86010 Personal history of colonic polyps: Secondary | ICD-10-CM

## 2022-05-03 DIAGNOSIS — D122 Benign neoplasm of ascending colon: Secondary | ICD-10-CM

## 2022-05-03 DIAGNOSIS — Z09 Encounter for follow-up examination after completed treatment for conditions other than malignant neoplasm: Secondary | ICD-10-CM | POA: Diagnosis not present

## 2022-05-03 MED ORDER — SODIUM CHLORIDE 0.9 % IV SOLN: 500.0000 mL | Freq: Once | INTRAVENOUS | Status: DC

## 2022-05-03 NOTE — Progress Notes (Signed)
A and O x3. Report to RN. Tolerated MAC anesthesia well. 

## 2022-05-03 NOTE — Progress Notes (Signed)
[facility] History and Physical   Primary Care Physician:  Lorrene Reid, PA-C   Reason for Procedure:   Hx colon polyps  Plan:    colonoscopy     HPI: Joshua Moreno is a 71 y.o. male w/ following polyp history  06/2008 - 7 mm adenoma 06/04/2014 Half-circumference flat-sessile polyp removed/ablated (TV adenoma) and 7 mm and 10 mm transverse polyps removed (tubular adenomas)  04/13/2015 sigmoidoscopy:no residual polyp seen - bx = ; diminutive descending polyp snared - no residual polyps - + adenoma  03/17/2019 6 diminutive right colon polyps removed all adenomas recall 2023 Past Medical History:  Diagnosis Date   Hyperlipidemia    Personal history of colonic polyps-adenoma 06/26/2008   Skin cancer of face    Actinic keratoses; followed annually by [facility]   Thyroid disease     Past Surgical History:  Procedure Laterality Date   COLONOSCOPY     HYDROCELE EXCISION / REPAIR     R scrotum   POLYPECTOMY     tail bone      Prior to Admission medications   Medication Sig Start Date End Date Taking? Authorizing Provider  Ascorbic Acid (VITAMIN C) 100 MG tablet Take 200 mg by mouth daily.   Yes [provider]  fluorouracil (EFUDEX) 5 % cream Apply topically.   Yes [provider]  levothyroxine (SYNTHROID) 112 MCG tablet Take 1 tablet (112 mcg total) by mouth daily. 06/06/21  Yes Abonza, Maritza, PA-C  potassium chloride (KLOR-CON) 10 MEQ tablet Take 1 tablet (10 mEq total) by mouth 2 (two) times daily. 11/22/21  Yes Nahser, Wonda Cheng, MD  pyridOXINE (VITAMIN B-6) 100 MG tablet Take 100 mg by mouth daily.   Yes [provider]  rosuvastatin (CRESTOR) 10 MG tablet TAKE 1 TABLET BY MOUTH DAILY. 02/13/22  Yes Nahser, Wonda Cheng, MD  hydrochlorothiazide (HYDRODIURIL) 25 MG tablet Take 1 tablet (25 mg total) by mouth daily. Patient not taking: Reported on 04/03/2022 11/22/21   Nahser, Wonda Cheng, MD  losartan (COZAAR) 50 MG tablet Take 1  tablet (50 mg total) by mouth daily. Patient not taking: Reported on 04/03/2022 11/22/21   Nahser, Wonda Cheng, MD    Current Outpatient Medications  Medication Sig Dispense Refill   Ascorbic Acid (VITAMIN C) 100 MG tablet Take 200 mg by mouth daily.     fluorouracil (EFUDEX) 5 % cream Apply topically.     levothyroxine (SYNTHROID) 112 MCG tablet Take 1 tablet (112 mcg total) by mouth daily. 90 tablet 3   potassium chloride (KLOR-CON) 10 MEQ tablet Take 1 tablet (10 mEq total) by mouth 2 (two) times daily. 180 tablet 3   pyridOXINE (VITAMIN B-6) 100 MG tablet Take 100 mg by mouth daily.     rosuvastatin (CRESTOR) 10 MG tablet TAKE 1 TABLET BY MOUTH DAILY. 90 tablet 2   hydrochlorothiazide (HYDRODIURIL) 25 MG tablet Take 1 tablet (25 mg total) by mouth daily. (Patient not taking: Reported on 04/03/2022) 90 tablet 3   losartan (COZAAR) 50 MG tablet Take 1 tablet (50 mg total) by mouth daily. (Patient not taking: Reported on 04/03/2022) 90 tablet 3   Current Facility-Administered Medications  Medication Dose Route Frequency Provider Last Rate Last Admin   0.9 %  sodium chloride infusion  500 mL Intravenous Once Gatha Mayer, MD        Allergies as of 05/03/2022   (No Known Allergies)    Family History  Problem Relation Age of Onset   Heart  disease Father 35       CABG/CAD   Alzheimer's disease Mother    Colon cancer Neg Hx    Esophageal cancer Neg Hx    Rectal cancer Neg Hx    Stomach cancer Neg Hx    Colon polyps Neg Hx     Social History   Socioeconomic History   Marital status: Widowed    Spouse name: Not on file   Number of children: Not on file   Years of education: Not on file   Highest education level: Not on file  Occupational History   Occupation: retired  Tobacco Use   Smoking status: Former    Packs/day: 1.00    Years: 35.00    Total pack years: 35.00    Types: Cigarettes    Quit date: 12/07/2003    Years since quitting: 18.4   Smokeless tobacco: Never   Vaping Use   Vaping Use: Never used  Substance and Sexual Activity   Alcohol use: Yes    Alcohol/week: 16.0 standard drinks of alcohol    Types: 4 Cans of beer, 6 Shots of liquor, 6 Standard drinks or equivalent per week   Drug use: No   Sexual activity: Yes    Birth control/protection: None  Other Topics Concern   Not on file  Social History Narrative   Marital status: married x 37 years widowed 12/2018 (wife had melanoma)      Children:  3 children; 3 grandchildren; no gg      Lives: with wife      Employment:  Press photographer truck tires x 40 years; 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      Tobacco: quit in 2000.  1 ppd x 30 years.      Alcohol:  Beer or bourbon daily; 5-8 drinks per week.        Exercise:  Plays tennis four days weekly. [employer].      ADLs: independent. Drives.      Advanced Directives: FULL CODE; no prolonged resuscitation.        Seatbelt: 100%; no texting while driving.           Social Determinants of Health   Financial Resource Strain: Low Risk  (06/06/2021)   Overall Financial Resource Strain (CARDIA)    Difficulty of Paying Living Expenses: Not hard at all  Food Insecurity: Not on file  Transportation Needs: No Transportation Needs (06/06/2021)   PRAPARE - Hydrologist (Medical): No    Lack of Transportation (Non-Medical): No  Physical Activity: Sufficiently Active (06/06/2021)   Exercise Vital Sign    Days of Exercise per Week: 7 days    Minutes of Exercise per Session: 120 min  Stress: Not on file  Social Connections: Not on file  Intimate Partner Violence: Not on file    Review of Systems:  All other review of systems negative except as mentioned in the HPI.  Physical Exam: Vital signs BP 131/77   Pulse (!) 53   Temp 98.6 F (37 C) (Temporal)   Ht '5\' 8"'$  (1.727 m)   Wt 188 lb (85.3 kg)   SpO2 100%   BMI 28.59 kg/m   General:   Alert,  Well-developed, well-nourished, pleasant and cooperative in NAD Lungs:  Clear  throughout to auscultation.   Heart:  Regular rate and rhythm; no murmurs, clicks, rubs,  or gallops. Abdomen:  Soft, nontender and nondistended. Normal bowel sounds.   Neuro/Psych:  Alert and cooperative. Normal mood  and affect. A and O x 3   '@Ermin Parisien'$  Simonne Maffucci, MD, [facility] [phone] (pager) 05/03/2022 2:20 PM@

## 2022-05-03 NOTE — Progress Notes (Signed)
Pt's states no medical or surgical changes since previsit or office visit. 

## 2022-05-03 NOTE — Op Note (Signed)
[facility] Patient Name: Joshua Moreno Procedure Date: 05/03/2022 2:20 PM MRN: [id number] Endoscopist: Gatha Mayer , MD, [id number] Age: 71 Referring MD:  Date of Birth: Jun 26, 1950 Gender: Male Account #: [id number] Procedure:                Colonoscopy Indications:              Surveillance: Personal history of adenomatous                            polyps on last colonoscopy 3 years ago, Last                            colonoscopy: 2020 Medicines:                Monitored Anesthesia Care Procedure:                Pre-Anesthesia Assessment:                           - Prior to the procedure, a History and Physical                            was performed, and patient medications and                            allergies were reviewed. The patient's tolerance of                            previous anesthesia was also reviewed. The risks                            and benefits of the procedure and the sedation                            options and risks were discussed with the patient.                            All questions were answered, and informed consent                            was obtained. Prior Anticoagulants: The patient has                            taken no anticoagulant or antiplatelet agents. ASA                            Grade Assessment: II - A patient with mild systemic                            disease. After reviewing the risks and benefits,                            the patient was deemed in satisfactory condition to  undergo the procedure.                           After obtaining informed consent, the colonoscope                            was passed under direct vision. Throughout the                            procedure, the patient's blood pressure, pulse, and                            oxygen saturations were monitored continuously. The                            Olympus CF-HQ190L [id number]) Colonoscope was                             introduced through the anus and advanced to the the                            cecum, identified by appendiceal orifice and                            ileocecal valve. The colonoscopy was performed                            without difficulty. The patient tolerated the                            procedure well. The quality of the bowel                            preparation was good. The ileocecal valve,                            appendiceal orifice, and rectum were photographed.                            The bowel preparation used was Miralax via split                            dose instruction. Scope In: 2:30:18 PM Scope Out: 2:42:47 PM Scope Withdrawal Time: 0 hours 10 minutes 48 seconds  Total Procedure Duration: 0 hours 12 minutes 29 seconds  Findings:                 The perianal and digital rectal examinations were                            normal.                           Two sessile polyps were found in the sigmoid colon  and ascending colon. The polyps were diminutive in                            size. These polyps were removed with a cold snare.                            Resection and retrieval were complete. Verification                            of patient identification for the specimen was                            done. Estimated blood loss was minimal.                           Multiple diverticula were found in the sigmoid                            colon.                           The exam was otherwise without abnormality on                            direct and retroflexion views. Complications:            No immediate complications. Estimated Blood Loss:     Estimated blood loss was minimal. Impression:               - Two diminutive polyps in the sigmoid colon and in                            the ascending colon, removed with a cold snare.                            Resected and retrieved.                            - Diverticulosis in the sigmoid colon.                           - Tattoos in descending colon from prior polypectomy                           - The examination was otherwise normal on direct                            and retroflexion views. Recommendation:           - Patient has a contact number available for                            emergencies. The signs and symptoms of potential                            delayed complications were  discussed with the                            patient. Return to normal activities tomorrow.                            Written discharge instructions were provided to the                            patient.                           - Resume previous diet.                           - Continue present medications.                           - Await pathology results.                           - Repeat colonoscopy is recommended. The                            colonoscopy date will be determined after pathology                            results from today's exam become available for                            review. Will plan to have office visit and discuss                            colonoscopy in 5 years 2028 9he will be 75 but                            significant polyp history Gatha Mayer, MD 05/03/2022 2:57:28 PM This report has been signed electronically.

## 2022-05-03 NOTE — Patient Instructions (Addendum)
Two tiny polyps seen and removed today. I will have them analyzed as usual and suggest we sit down in 5 years and decide if it makes sense to repeat a colonoscopy   Please resume your medications and diet as instructed.  I appreciate the opportunity to care for you. Gatha Mayer, MD, FACG   YOU HAD AN ENDOSCOPIC PROCEDURE TODAY AT [facility]:   Refer to the procedure report that was given to you for any specific questions about what was found during the examination.  If the procedure report does not answer your questions, please call your gastroenterologist to clarify.  If you requested that your care partner not be given the details of your procedure findings, then the procedure report has been included in a sealed envelope for you to review at your convenience later.  YOU SHOULD EXPECT: Some feelings of bloating in the abdomen. Passage of more gas than usual.  Walking can help get rid of the air that was put into your GI tract during the procedure and reduce the bloating. If you had a lower endoscopy (such as a colonoscopy or flexible sigmoidoscopy) you may notice spotting of blood in your stool or on the toilet paper. If you underwent a bowel prep for your procedure, you may not have a normal bowel movement for a few days.  Please Note:  You might notice some irritation and congestion in your nose or some drainage.  This is from the oxygen used during your procedure.  There is no need for concern and it should clear up in a day or so.  SYMPTOMS TO REPORT IMMEDIATELY:  Following lower endoscopy (colonoscopy or flexible sigmoidoscopy):  Excessive amounts of blood in the stool  Significant tenderness or worsening of abdominal pains  Swelling of the abdomen that is new, acute  Fever of 100F or higher  For urgent or emergent issues, a gastroenterologist can be reached at any hour by calling [phone]. Do not use MyChart messaging for urgent concerns.    DIET:  We  do recommend a small meal at first, but then you may proceed to your regular diet.  Drink plenty of fluids but you should avoid alcoholic beverages for 24 hours.  ACTIVITY:  You should plan to take it easy for the rest of today and you should NOT DRIVE or use heavy machinery until tomorrow (because of the sedation medicines used during the test).    FOLLOW UP: Our staff will call the number listed on your records the next business day following your procedure.  We will call around 7:15- 8:00 am to check on you and address any questions or concerns that you may have regarding the information given to you following your procedure. If we do not reach you, we will leave a message.     If any biopsies were taken you will be contacted by phone or by letter within the next 1-3 weeks.  Please call us at [phone] if you have not heard about the biopsies in 3 weeks.    SIGNATURES/CONFIDENTIALITY: You and/or your care partner have signed paperwork which will be entered into your electronic medical record.  These signatures attest to the fact that that the information above on your After Visit Summary has been reviewed and is understood.  Full responsibility of the confidentiality of this discharge information lies with you and/or your care-partner.

## 2022-05-03 NOTE — Progress Notes (Signed)
Called to room to assist during endoscopic procedure.  Patient ID and intended procedure confirmed with present staff. Received instructions for my participation in the procedure from the performing physician.  

## 2022-05-04 ENCOUNTER — Telehealth: Payer: Self-pay | Admitting: *Deleted

## 2022-05-04 NOTE — Telephone Encounter (Signed)
Attempted to call patient for their post-procedure follow-up call. No answer. Left voicemail.   

## 2022-05-05 DIAGNOSIS — M79645 Pain in left finger(s): Secondary | ICD-10-CM | POA: Diagnosis not present

## 2022-05-15 DIAGNOSIS — M79645 Pain in left finger(s): Secondary | ICD-10-CM | POA: Diagnosis not present

## 2022-05-16 DIAGNOSIS — M79645 Pain in left finger(s): Secondary | ICD-10-CM | POA: Diagnosis not present

## 2022-05-16 DIAGNOSIS — S63283A Dislocation of proximal interphalangeal joint of left middle finger, initial encounter: Secondary | ICD-10-CM | POA: Diagnosis not present

## 2022-05-17 ENCOUNTER — Encounter: Payer: Self-pay | Admitting: Internal Medicine

## 2022-05-26 DIAGNOSIS — M79645 Pain in left finger(s): Secondary | ICD-10-CM | POA: Diagnosis not present

## 2022-06-06 ENCOUNTER — Other Ambulatory Visit: Payer: Self-pay | Admitting: Nurse Practitioner

## 2022-06-06 DIAGNOSIS — E039 Hypothyroidism, unspecified: Secondary | ICD-10-CM

## 2022-06-06 NOTE — Telephone Encounter (Signed)
L.O.V: 06/06/21   N.O.V: Not scheduled   L.R.F: Levothyroxine 90 tab 3 refill  Refill denied. OV required

## 2022-06-07 NOTE — Telephone Encounter (Signed)
Pt is scheduled to have AWV on 07/19/22 with Lilia Pro.

## 2022-06-07 NOTE — Telephone Encounter (Signed)
60 day sent

## 2022-06-12 DIAGNOSIS — M79645 Pain in left finger(s): Secondary | ICD-10-CM | POA: Diagnosis not present

## 2022-06-13 DIAGNOSIS — S63283A Dislocation of proximal interphalangeal joint of left middle finger, initial encounter: Secondary | ICD-10-CM | POA: Diagnosis not present

## 2022-07-19 ENCOUNTER — Encounter: Payer: Self-pay | Admitting: Family Medicine

## 2022-07-19 ENCOUNTER — Ambulatory Visit (INDEPENDENT_AMBULATORY_CARE_PROVIDER_SITE_OTHER): Payer: PPO | Admitting: Family Medicine

## 2022-07-19 VITALS — BP 136/81 | HR 72 | Resp 18 | Ht 68.0 in | Wt 183.0 lb

## 2022-07-19 DIAGNOSIS — Z0000 Encounter for general adult medical examination without abnormal findings: Secondary | ICD-10-CM | POA: Diagnosis not present

## 2022-07-19 DIAGNOSIS — E785 Hyperlipidemia, unspecified: Secondary | ICD-10-CM | POA: Diagnosis not present

## 2022-07-19 DIAGNOSIS — Z6827 Body mass index (BMI) 27.0-27.9, adult: Secondary | ICD-10-CM

## 2022-07-19 DIAGNOSIS — E039 Hypothyroidism, unspecified: Secondary | ICD-10-CM | POA: Diagnosis not present

## 2022-07-19 DIAGNOSIS — E663 Overweight: Secondary | ICD-10-CM

## 2022-07-19 NOTE — Progress Notes (Signed)
Subjective:   Joshua Moreno is a 72 y.o. male who presents for Medicare Annual/Subsequent preventive examination.  Patient has discussed with the cardiologist that since he is so active, he can continue monitoring his blood pressure at home without taking any medication.  He is managing with a low-sodium diet and exercise and follows annually with cardiology.  Review of Systems    Review of Systems  All other systems reviewed and are negative.   Cardiac Risk Factors include: none     Objective:    Today's Vitals   07/19/22 1322  BP: 136/81  Pulse: 72  Resp: 18  SpO2: 97%  Weight: 183 lb (83 kg)  Height: '5\' 8"'$  (1.727 m)   Body mass index is 27.83 kg/m.     06/05/2019    8:10 AM 03/13/2018    2:16 PM 02/13/2017    8:34 AM 01/11/2016    8:29 AM 03/29/2015   10:37 AM 01/06/2015   10:52 AM  Advanced Directives  Does Patient Have a Medical Advance Directive? No Yes Yes Yes Yes Yes  Type of Scientist, physiological of [place];Living will [place];Living will [place];Living will   Does patient want to make changes to medical advance directive?  No - Patient declined   No - Patient declined   Copy of [place] in Chart?   No - copy requested Yes No - copy requested No - copy requested  Would patient like information on creating a medical advance directive? No - Patient declined         Current Medications (verified) Outpatient Encounter Medications as of 07/19/2022  Medication Sig   Ascorbic Acid (VITAMIN C) 100 MG tablet Take 200 mg by mouth daily.   fluorouracil (EFUDEX) 5 % cream Apply topically.   potassium chloride (KLOR-CON) 10 MEQ tablet Take 1 tablet (10 mEq total) by mouth 2 (two) times daily.   pyridOXINE (VITAMIN B-6) 100 MG tablet Take 100 mg by mouth daily.   rosuvastatin (CRESTOR) 10 MG tablet TAKE 1 TABLET BY MOUTH DAILY.   [DISCONTINUED] hydrochlorothiazide (HYDRODIURIL) 25 MG tablet Take 1  tablet (25 mg total) by mouth daily. (Patient not taking: Reported on 04/03/2022)   [DISCONTINUED] levothyroxine (SYNTHROID) 112 MCG tablet TAKE 1 TABLET (112 MCG TOTAL) BY MOUTH DAILY. (Patient not taking: Reported on 07/19/2022)   [DISCONTINUED] losartan (COZAAR) 50 MG tablet Take 1 tablet (50 mg total) by mouth daily. (Patient not taking: Reported on 04/03/2022)   No facility-administered encounter medications on file as of 07/19/2022.    Allergies (verified) Patient has no known allergies.   History: Past Medical History:  Diagnosis Date   Hyperlipidemia    Personal history of colonic polyps-adenoma 06/26/2008   Skin cancer of face    Actinic keratoses; followed annually by [facility]   Thyroid disease    Past Surgical History:  Procedure Laterality Date   COLONOSCOPY     HYDROCELE EXCISION / REPAIR     R scrotum   POLYPECTOMY     tail bone     Family History  Problem Relation Age of Onset   Heart disease Father 18       CABG/CAD   Alzheimer's disease Mother    Colon cancer Neg Hx    Esophageal cancer Neg Hx    Rectal cancer Neg Hx    Stomach cancer Neg Hx    Colon polyps Neg Hx    Social History  Socioeconomic History   Marital status: Widowed    Spouse name: Not on file   Number of children: Not on file   Years of education: Not on file   Highest education level: Not on file  Occupational History   Occupation: retired  Tobacco Use   Smoking status: Former    Packs/day: 1.00    Years: 35.00    Total pack years: 35.00    Types: Cigarettes    Quit date: 12/07/2003    Years since quitting: 18.6    Passive exposure: Never   Smokeless tobacco: Never  Vaping Use   Vaping Use: Never used  Substance and Sexual Activity   Alcohol use: Yes    Alcohol/week: 16.0 standard drinks of alcohol    Types: 4 Cans of beer, 6 Shots of liquor, 6 Standard drinks or equivalent per week   Drug use: No   Sexual activity: Yes    Birth control/protection: None   Other Topics Concern   Not on file  Social History Narrative   Marital status: married x 37 years widowed 12/2018 (wife had melanoma)      Children:  3 children; 3 grandchildren; no gg      Lives: with wife      Employment:  Press photographer truck tires x 40 years; 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      Tobacco: quit in 2000.  1 ppd x 30 years.      Alcohol:  Beer or bourbon daily; 5-8 drinks per week.        Exercise:  Plays tennis four days weekly. [employer].      ADLs: independent. Drives.      Advanced Directives: FULL CODE; no prolonged resuscitation.        Seatbelt: 100%; no texting while driving.           Social Determinants of Health   Financial Resource Strain: Low Risk  (07/19/2022)   Overall Financial Resource Strain (CARDIA)    Difficulty of Paying Living Expenses: Not hard at all  Food Insecurity: No Food Insecurity (07/19/2022)   Hunger Vital Sign    Worried About Running Out of Food in the Last Year: Never true    Ran Out of Food in the Last Year: Never true  Transportation Needs: No Transportation Needs (07/19/2022)   PRAPARE - Hydrologist (Medical): No    Lack of Transportation (Non-Medical): No  Physical Activity: Sufficiently Active (07/19/2022)   Exercise Vital Sign    Days of Exercise per Week: 7 days    Minutes of Exercise per Session: 120 min  Stress: No Stress Concern Present (07/19/2022)   [place]    Feeling of Stress : Not at all  Social Connections: [place] (07/19/2022)   Social Connection and Isolation Panel [NHANES]    Frequency of Communication with Friends and Family: More than three times a week    Frequency of Social Gatherings with Friends and Family: More than three times a week    Attends Religious Services: More than 4 times per year    Active Member of [employer] or Organizations: Yes    Attends Music therapist: More than 4 times per year     Marital Status: Married    Tobacco Counseling Counseling given: Not Answered   Clinical Intake:  Pre-visit preparation completed: No  Pain : No/denies pain     BMI - recorded: 27.83 Nutritional Status: BMI  25 -29 Overweight Nutritional Risks: None Diabetes: No  How often do you need to have someone help you when you read instructions, pamphlets, or other written materials from your doctor or pharmacy?: 1 - Never  Diabetic?No  Interpreter Needed?: No  Information entered by :: Leanord Asal, RMA   Activities of Daily Living    07/19/2022    1:34 PM 07/15/2022    4:40 PM  In your present state of health, do you have any difficulty performing the following activities:  Hearing? 0 0  Vision? 0 0  Difficulty concentrating or making decisions? 0 0  Walking or climbing stairs? 0 0  Dressing or bathing? 0 0  Doing errands, shopping? 0 0  Preparing Food and eating ? N N  Using the Toilet? N N  In the past six months, have you accidently leaked urine? N N  Do you have problems with loss of bowel control? N N  Managing your Medications? N N  Managing your Finances? N N  Housekeeping or managing your Housekeeping? N N    Patient Care Team: Velva Harman, PA as PCP - General (Family Medicine) Nahser, Wonda Cheng, MD as PCP - Cardiology (Cardiology) [place], [place], [place] (Optometry) Harriett Sine, MD as Consulting Physician (Dermatology)  Indicate any recent Medical Services you may have received from other than [facility] providers in the past year (date may be approximate).     Assessment:   This is a routine wellness examination for Joshua Moreno.  Hearing/Vision screen Vision Screening - Comments:: Followed by [facility]  Dietary issues and exercise activities discussed: Current Exercise Habits: Home exercise routine, Type of exercise: Other - see comments (plays tennis), Time (Minutes): > 60, Frequency (Times/Week): 7, Weekly Exercise (Minutes/Week): 0, Intensity:  Intense, Exercise limited by: None identified   Goals Addressed               This Visit's Progress     Weight (lb) < 180 lb (81.6 kg) (pt-stated)   183 lb (83 kg)     Patient states that he wants to try to lose 5 lbs and start increasing his water intake.       Depression Screen    07/19/2022    1:27 PM 06/06/2021    3:27 PM 05/26/2020    1:46 PM 11/19/2019   11:17 AM 05/20/2019    8:16 AM 04/17/2018    3:00 PM 03/13/2018    2:26 PM  PHQ 2/9 Scores  PHQ - 2 Score 0 0 0 0 0 0 0  PHQ- 9 Score  0 0 0 1 0 0    Fall Risk    07/19/2022    1:33 PM 07/15/2022    4:40 PM 06/06/2021    3:26 PM 05/26/2020    1:46 PM 05/20/2019    8:14 AM  Fall Risk   Falls in the past year? '1 1 1 '$ 0 0  Number falls in past yr: 0 0 0    Injury with Fall? 1 1 0    Risk for fall due to :   No Fall Risks    Follow up   Falls evaluation completed Falls evaluation completed Falls evaluation completed    [place]:  Any stairs in or around the home? Yes  If so, are there any without handrails? Yes  Home free of loose throw rugs in walkways, pet beds, electrical cords, etc? Yes  Adequate lighting in your home to reduce risk  of falls? Yes   ASSISTIVE DEVICES UTILIZED TO PREVENT FALLS:  Life alert? No  Use of a cane, walker or w/c? No  Grab bars in the bathroom? Yes  Shower chair or bench in shower? Yes  Elevated toilet seat or a handicapped toilet? Yes   TIMED UP AND GO:  Was the test performed? Yes .  Length of time to ambulate 10 feet: 10-15 sec.   Gait steady and fast without use of assistive device  Cognitive Function:        07/19/2022    1:27 PM 06/06/2021    3:18 PM 05/26/2020    1:46 PM 05/20/2019    8:15 AM 02/13/2017    8:40 AM  6CIT Screen  What Year? 0 points 0 points 0 points 0 points 0 points  What month? 0 points 0 points 0 points 0 points 0 points  What time?  0 points 0 points 0 points 0 points  Count back from 20 0 points 0 points 0  points 0 points 0 points  Months in reverse 0 points 0 points 0 points 0 points 0 points  Repeat phrase 0 points 0 points 0 points 0 points 2 points  Total Score  0 points 0 points 0 points 2 points    Immunizations Immunization History  Administered Date(s) Administered   Influenza Split 05/26/2009   Influenza, High Dose Seasonal PF 04/17/2018, 02/14/2022   Influenza,inj,Quad PF,6+ Mos 01/11/2016, 02/13/2017   Influenza-Unspecified 02/14/2019, 02/21/2021   PFIZER(Purple Top)SARS-COV-2 Vaccination 06/30/2019, 07/21/2019, 04/29/2020   Pneumococcal Conjugate-13 01/06/2015   Pneumococcal Polysaccharide-23 05/26/2009, 02/13/2017   Tdap 11/30/2011, 05/14/2018   Zoster Recombinat (Shingrix) 06/24/2020   Zoster, Live 05/08/2009    TDAP status: Up to date  Flu Vaccine status: Up to date  Pneumococcal vaccine status: Up to date  Covid-19 vaccine status: Information provided on how to obtain vaccines.   Qualifies for Shingles Vaccine? Yes   Zostavax completed No   Shingrix Completed?: No.    Education has been provided regarding the importance of this vaccine. Patient has been advised to call insurance company to determine out of pocket expense if they have not yet received this vaccine. Advised may also receive vaccine at local pharmacy or Health Dept. Verbalized acceptance and understanding.  Screening Tests Health Maintenance  Topic Date Due   Zoster Vaccines- Shingrix (2 of 2) 08/19/2020   COVID-19 Vaccine (4 - 2023-24 season) 08/04/2022 (Originally 01/06/2022)   Medicare Annual Wellness (AWV)  07/19/2023   COLONOSCOPY (Pts 45-30yr Insurance coverage will need to be confirmed)  05/04/2027   DTaP/Tdap/Td (3 - Td or Tdap) 05/14/2028   Pneumonia Vaccine 72 Years old  Completed   INFLUENZA VACCINE  Completed   Hepatitis C Screening  Completed   HPV VACCINES  Aged Out    Health Maintenance  Health Maintenance Due  Topic Date Due   Zoster Vaccines- Shingrix (2 of 2) 08/19/2020     Colorectal cancer screening: Type of screening: Colonoscopy. Completed 05/03/2022. Repeat every 5 years  Lung Cancer Screening: (Low Dose CT Chest recommended if Age 74-80years, 30 pack-year currently smoking OR have quit w/in 15years.) does not qualify.     Additional Screening:  Hepatitis C Screening: does qualify; Completed 01/06/2015  Vision Screening: Recommended annual ophthalmology exams for early detection of glaucoma and other disorders of the eye. Is the patient up to date with their annual eye exam?  Yes  Who is the provider or what is the name of the office  in which the patient attends annual eye exams? [facility] If pt is not established with a provider, would they like to be referred to a provider to establish care?  Pt established .   Dental Screening: Recommended annual dental exams for proper oral hygiene  Community Resource Referral / Chronic Care Management: CRR required this visit?  No   CCM required this visit?  No      Plan:     I have personally reviewed and noted the following in the patient's chart:   Medical and social history Use of alcohol, tobacco or illicit drugs  Current medications and supplements including opioid prescriptions. Patient is not currently taking opioid prescriptions. Functional ability and status Nutritional status Physical activity Advanced directives List of other physicians Hospitalizations, surgeries, and ER visits in previous 12 months Vitals Screenings to include cognitive, depression, and falls Referrals and appointments  In addition, I have reviewed and discussed with patient certain preventive protocols, quality metrics, and best practice recommendations. A written personalized care plan for preventive services as well as general preventive health recommendations were provided to patient.    Return in 6 months (on 01/19/2023) for follow-up.  Velva Harman, PA   07/19/2022   Nurse Notes: Face to face 20  min

## 2022-07-25 ENCOUNTER — Other Ambulatory Visit: Payer: PPO

## 2022-07-25 DIAGNOSIS — E785 Hyperlipidemia, unspecified: Secondary | ICD-10-CM

## 2022-07-25 DIAGNOSIS — Z0000 Encounter for general adult medical examination without abnormal findings: Secondary | ICD-10-CM

## 2022-07-25 DIAGNOSIS — Z6827 Body mass index (BMI) 27.0-27.9, adult: Secondary | ICD-10-CM

## 2022-07-25 DIAGNOSIS — E039 Hypothyroidism, unspecified: Secondary | ICD-10-CM

## 2022-07-25 DIAGNOSIS — E663 Overweight: Secondary | ICD-10-CM | POA: Diagnosis not present

## 2022-07-26 LAB — CBC WITH DIFFERENTIAL/PLATELET
Basophils Absolute: 0.1 10*3/uL (ref 0.0–0.2)
Basos: 1 %
EOS (ABSOLUTE): 0.2 10*3/uL (ref 0.0–0.4)
Eos: 3 %
Hematocrit: 47.3 % (ref 37.5–51.0)
Hemoglobin: 16.2 g/dL (ref 13.0–17.7)
Immature Grans (Abs): 0 10*3/uL (ref 0.0–0.1)
Immature Granulocytes: 0 %
Lymphocytes Absolute: 1.6 10*3/uL (ref 0.7–3.1)
Lymphs: 22 %
MCH: 32.1 pg (ref 26.6–33.0)
MCHC: 34.2 g/dL (ref 31.5–35.7)
MCV: 94 fL (ref 79–97)
Monocytes Absolute: 0.6 10*3/uL (ref 0.1–0.9)
Monocytes: 8 %
Neutrophils Absolute: 4.7 10*3/uL (ref 1.4–7.0)
Neutrophils: 66 %
Platelets: 256 10*3/uL (ref 150–450)
RBC: 5.04 x10E6/uL (ref 4.14–5.80)
RDW: 12 % (ref 11.6–15.4)
WBC: 7.2 10*3/uL (ref 3.4–10.8)

## 2022-07-26 LAB — COMPREHENSIVE METABOLIC PANEL
ALT: 36 IU/L (ref 0–44)
AST: 34 IU/L (ref 0–40)
Albumin/Globulin Ratio: 1.7 (ref 1.2–2.2)
Albumin: 4.2 g/dL (ref 3.8–4.8)
Alkaline Phosphatase: 94 IU/L (ref 44–121)
BUN/Creatinine Ratio: 15 (ref 10–24)
BUN: 17 mg/dL (ref 8–27)
Bilirubin Total: 0.7 mg/dL (ref 0.0–1.2)
CO2: 22 mmol/L (ref 20–29)
Calcium: 9.6 mg/dL (ref 8.6–10.2)
Chloride: 105 mmol/L (ref 96–106)
Creatinine, Ser: 1.13 mg/dL (ref 0.76–1.27)
Globulin, Total: 2.5 g/dL (ref 1.5–4.5)
Glucose: 89 mg/dL (ref 70–99)
Potassium: 4.7 mmol/L (ref 3.5–5.2)
Sodium: 144 mmol/L (ref 134–144)
Total Protein: 6.7 g/dL (ref 6.0–8.5)
eGFR: 69 mL/min/{1.73_m2} (ref >59)

## 2022-07-26 LAB — LIPID PANEL
Chol/HDL Ratio: 2.4 ratio (ref 0.0–5.0)
Cholesterol, Total: 201 mg/dL — ABNORMAL HIGH (ref 100–199)
HDL: 83 mg/dL (ref >39)
LDL Chol Calc (NIH): 105 mg/dL — ABNORMAL HIGH (ref 0–99)
Triglycerides: 73 mg/dL (ref 0–149)
VLDL Cholesterol Cal: 13 mg/dL (ref 5–40)

## 2022-07-26 LAB — HEMOGLOBIN A1C
Est. average glucose Bld gHb Est-mCnc: 114 mg/dL
Hgb A1c MFr Bld: 5.6 % (ref 4.8–5.6)

## 2022-07-26 LAB — TSH: TSH: 2 u[IU]/mL (ref 0.450–4.500)

## 2022-08-14 ENCOUNTER — Other Ambulatory Visit: Payer: Self-pay | Admitting: Nurse Practitioner

## 2022-08-14 DIAGNOSIS — E039 Hypothyroidism, unspecified: Secondary | ICD-10-CM

## 2022-10-09 ENCOUNTER — Other Ambulatory Visit: Payer: Self-pay | Admitting: Cardiovascular Disease

## 2022-10-09 ENCOUNTER — Other Ambulatory Visit: Payer: Self-pay | Admitting: Family Medicine

## 2022-10-09 DIAGNOSIS — E039 Hypothyroidism, unspecified: Secondary | ICD-10-CM

## 2022-11-22 DIAGNOSIS — H35371 Puckering of macula, right eye: Secondary | ICD-10-CM | POA: Diagnosis not present

## 2022-11-28 DIAGNOSIS — L821 Other seborrheic keratosis: Secondary | ICD-10-CM | POA: Diagnosis not present

## 2022-11-28 DIAGNOSIS — L814 Other melanin hyperpigmentation: Secondary | ICD-10-CM | POA: Diagnosis not present

## 2022-11-28 DIAGNOSIS — Z85820 Personal history of malignant melanoma of skin: Secondary | ICD-10-CM | POA: Diagnosis not present

## 2022-11-28 DIAGNOSIS — L570 Actinic keratosis: Secondary | ICD-10-CM | POA: Diagnosis not present

## 2022-11-28 DIAGNOSIS — D485 Neoplasm of uncertain behavior of skin: Secondary | ICD-10-CM | POA: Diagnosis not present

## 2022-11-28 DIAGNOSIS — Z85828 Personal history of other malignant neoplasm of skin: Secondary | ICD-10-CM | POA: Diagnosis not present

## 2022-12-17 NOTE — Progress Notes (Signed)
Cardiology Office Note:    Date:  12/18/2022   ID:  Joshua Moreno, Joshua Moreno [birth year], MRN [id number]  PCP:  Melida Quitter, PA  Cardiologist:  Kristeen Miss, MD  Electrophysiologist:  None   Referring MD: Melida Quitter, PA   1.  Problem list 1.  Coronary artery calcifications 2.  Right bundle branch block 3.  Hyperlipidemia 4.  Hypothyroidism  Chief Complaint  Patient presents with   RBBB   Hyperlipidemia     Feb. 11, 2020    Joshua Moreno is a 72 y.o. male with a hx of hyperlipidemia and right bundle branch block.  He recently had a CT of the chest for cancer screening and was found to have coronary artery calcifications.   Denies any chest pain or shortness of breath.  He plays competitive tennis 4 times a week.  Can play for 2 hours a day in the summer .    Retired in the Physiological scientist business.    Lots of volunteer work at [employer] No syncope  Has been on atorvastatin for 10 years . Lipids look okay.  His LDL is 103.  Total cholesterol is 187.  HDL is 67.  Triglyceride level is 87.  July 10, 2019  Joshua Moreno is seen back today for follow-up of his hyperlipidemia and right bundle branch block. Wife passed away last summer.  No CP or dyspnea Still plays tennis 6 day a week .    November 18, 2020; Joshua Moreno is seen back today for his HLD and RBBB Still playing tennis  No Cp , no dyspnea  Still eating some salt -  BP at home is in the 130s   November 22, 2021 Joshua Moreno is seen today for follow up of his HLD and RBBB BP is mildly elevated Does not eat many hot dogs     Aug. 12, 2024 Joshua Moreno is seen for follow up of his RBBB, HLD   Watches his salt  Exercises 6 days a week.  Plays tennis  No CP , no  dyspnea   BP has been well controlled       Past Medical History:  Diagnosis Date   Hyperlipidemia    Personal history of colonic polyps-adenoma 06/26/2008   Skin cancer of face    Actinic keratoses; followed annually by [facility]   Thyroid  disease     Past Surgical History:  Procedure Laterality Date   COLONOSCOPY     HYDROCELE EXCISION / REPAIR     R scrotum   POLYPECTOMY     tail bone      Current Medications: Current Meds  Medication Sig   Ascorbic Acid (VITAMIN C) 100 MG tablet Take 200 mg by mouth daily.   fluorouracil (EFUDEX) 5 % cream Apply topically.   IBU 400 MG tablet Take 400 mg by mouth every 4 (four) hours as needed.   levothyroxine (SYNTHROID) 112 MCG tablet TAKE 1 TABLET (112 MCG TOTAL) BY MOUTH DAILY.   potassium chloride (KLOR-CON) 10 MEQ tablet Take 1 tablet (10 mEq total) by mouth 2 (two) times daily.   pyridOXINE (VITAMIN B-6) 100 MG tablet Take 100 mg by mouth daily.   rosuvastatin (CRESTOR) 10 MG tablet TAKE 1 TABLET BY MOUTH DAILY.     Allergies:   Patient has no known allergies.   Social History   Socioeconomic History   Marital status: Widowed    Spouse name: Not on file   Number of children: Not on file  Years of education: Not on file   Highest education level: Not on file  Occupational History   Occupation: retired  Tobacco Use   Smoking status: Former    Current packs/day: 0.00    Average packs/day: 1 pack/day for 35.0 years (35.0 ttl pk-yrs)    Types: Cigarettes    Start date: 12/06/1968    Quit date: 12/07/2003    Years since quitting: 19.0    Passive exposure: Never   Smokeless tobacco: Never  Vaping Use   Vaping status: Never Used  Substance and Sexual Activity   Alcohol use: Yes    Alcohol/week: 16.0 standard drinks of alcohol    Types: 4 Cans of beer, 6 Shots of liquor, 6 Standard drinks or equivalent per week   Drug use: No   Sexual activity: Yes    Birth control/protection: None  Other Topics Concern   Not on file  Social History Narrative   Marital status: married x 37 years widowed 12/2018 (wife had melanoma)      Children:  3 children; 3 grandchildren; no gg      Lives: with wife      Employment:  Airline pilot truck tires x 40 years; 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       Tobacco: quit in 2000.  1 ppd x 30 years.      Alcohol:  Beer or bourbon daily; 5-8 drinks per week.        Exercise:  Plays tennis four days weekly. [employer].      ADLs: independent. Drives.      Advanced Directives: FULL CODE; no prolonged resuscitation.        Seatbelt: 100%; no texting while driving.           Social Determinants of Health   Financial Resource Strain: Low Risk  (07/19/2022)   Overall Financial Resource Strain (CARDIA)    Difficulty of Paying Living Expenses: Not hard at all  Food Insecurity: No Food Insecurity (07/19/2022)   Hunger Vital Sign    Worried About Running Out of Food in the Last Year: Never true    Ran Out of Food in the Last Year: Never true  Transportation Needs: No Transportation Needs (07/19/2022)   PRAPARE - Administrator, Civil Service (Medical): No    Lack of Transportation (Non-Medical): No  Physical Activity: Sufficiently Active (07/19/2022)   Exercise Vital Sign    Days of Exercise per Week: 7 days    Minutes of Exercise per Session: 120 min  Stress: No Stress Concern Present (07/19/2022)   [employer] of Occupational Health - Occupational Stress Questionnaire    Feeling of Stress : Not at all  Social Connections: Socially Integrated (07/19/2022)   Social Connection and Isolation Panel [NHANES]    Frequency of Communication with Friends and Family: More than three times a week    Frequency of Social Gatherings with Friends and Family: More than three times a week    Attends Religious Services: More than 4 times per year    Active Member of [employer] or Organizations: Yes    Attends Engineer, structural: More than 4 times per year    Marital Status: Married     Family History: The patient's family history includes Alzheimer's disease in his mother; Heart disease (age of onset: 63) in his father. There is no history of Colon cancer, Esophageal cancer, Rectal cancer, Stomach cancer, or Colon polyps.    ROS:    Please see the history  of present illness.     All other systems reviewed and are negative.  EKGs/Labs/Other Studies Reviewed:    The following studies were reviewed today:   EKG:     EKG Interpretation Date/Time:  Monday December 18 2022 16:19:54 EDT Ventricular Rate:  55 PR Interval:  162 QRS Duration:  152 QT Interval:  452 QTC Calculation: 432 R Axis:   -27  Text Interpretation: Sinus bradycardia Right bundle branch block No previous ECGs available Confirmed by Kristeen Miss [id number]) on 12/18/2022 4:37:33 PM    Recent Labs: 07/25/2022: ALT 36; BUN 17; Creatinine, Ser 1.13; Hemoglobin 16.2; Platelets 256; Potassium 4.7; Sodium 144; TSH 2.000  Recent Lipid Panel    Component Value Date/Time   CHOL 201 (H) 07/25/2022 0934   TRIG 73 07/25/2022 0934   HDL 83 07/25/2022 0934   CHOLHDL 2.4 07/25/2022 0934   CHOLHDL 2.1 01/11/2016 0819   VLDL 17 01/11/2016 0819   LDLCALC 105 (H) 07/25/2022 0934    Physical Exam:    Physical Exam: Blood pressure 136/60, pulse (!) 55, height 5\' 8"  (1.727 m), weight 190 lb 3.2 oz (86.3 kg), SpO2 96%.       GEN:  Well nourished, well developed in no acute distress HEENT: Normal NECK: No JVD; No carotid bruits LYMPHATICS: No lymphadenopathy CARDIAC: RRR   RESPIRATORY:  Clear to auscultation without rales, wheezing or rhonchi  ABDOMEN: Soft, non-tender, non-distended MUSCULOSKELETAL:  No edema; No deformity  SKIN: Warm and dry NEUROLOGIC:  Alert and oriented x 3     ASSESSMENT:    1. Mixed hyperlipidemia   2. Primary hypertension   3. RBBB      PLAN:        Coronary Artery disease:     He did not denies any chest pain or shortness of breath.  Continue current medications.   2.  Right bundle branch block: Stable.   3.  Hyperlipidemia -lipids from March of this year showed an LDL of 105.  Will continue with his low-fat low-salt diet.  Continue rosuvastatin.  4. HTN:         I have advised him to get an Omron blood  pressure cuff.  He will take readings periodically and send them in via MyChart.     Medication Adjustments/Labs and Tests Ordered: Current medicines are reviewed at length with the patient today.  Concerns regarding medicines are outlined above.  Orders Placed This Encounter  Procedures   EKG 12-Lead    No orders of the defined types were placed in this encounter.   Patient Instructions  Medication Instructions:  Your physician recommends that you continue on your current medications as directed. Please refer to the Current Medication list given to you today.  *If you need a refill on your cardiac medications before your next appointment, please call your pharmacy*   Lab Work: NONE If you have labs (blood work) drawn today and your tests are completely normal, you will receive your results only by: MyChart Message (if you have MyChart) OR A paper copy in the mail If you have any lab test that is abnormal or we need to change your treatment, we will call you to review the results.   Testing/Procedures: NONE  Follow-Up: At [facility], you and your health needs are our priority.  As part of our continuing mission to provide you with exceptional heart care, we have created designated Provider Care Teams.  These Care Teams include your primary Cardiologist (physician) and Advanced  Practice Providers (APPs -  Physician Assistants and Nurse Practitioners) who all work together to provide you with the care you need, when you need it.  Your next appointment:   1 year(s)  Provider:   Kristeen Miss, MD        Signed, Kristeen Miss, MD  12/18/2022 4:37 PM    [facility]

## 2022-12-18 ENCOUNTER — Encounter: Payer: Self-pay | Admitting: Cardiovascular Disease

## 2022-12-18 ENCOUNTER — Ambulatory Visit: Payer: PPO | Attending: Cardiovascular Disease | Admitting: Cardiovascular Disease

## 2022-12-18 VITALS — BP 136/60 | HR 55 | Ht 68.0 in | Wt 190.2 lb

## 2022-12-18 DIAGNOSIS — I4510 Unspecified right bundle-branch block: Secondary | ICD-10-CM

## 2022-12-18 DIAGNOSIS — E782 Mixed hyperlipidemia: Secondary | ICD-10-CM | POA: Diagnosis not present

## 2022-12-18 DIAGNOSIS — I10 Essential (primary) hypertension: Secondary | ICD-10-CM | POA: Diagnosis not present

## 2022-12-18 MED ORDER — ROSUVASTATIN CALCIUM 10 MG PO TABS: 10.0000 mg | ORAL_TABLET | Freq: Every day | ORAL | 3 refills | Status: DC

## 2022-12-18 NOTE — Patient Instructions (Signed)
Medication Instructions:  Your physician recommends that you continue on your current medications as directed. Please refer to the Current Medication list given to you today.  *If you need a refill on your cardiac medications before your next appointment, please call your pharmacy*   Lab Work: NONE If you have labs (blood work) drawn today and your tests are completely normal, you will receive your results only by: [place] (if you have MyChart) OR A paper copy in the mail If you have any lab test that is abnormal or we need to change your treatment, we will call you to review the results.   Testing/Procedures: NONE   Follow-Up: At [facility], you and your health needs are our priority.  As part of our continuing mission to provide you with exceptional heart care, we have created designated Provider Care Teams.  These Care Teams include your primary Cardiologist (physician) and Advanced Practice Providers (APPs -  Physician Assistants and Nurse Practitioners) who all work together to provide you with the care you need, when you need it.  Your next appointment:   1 year(s)  Provider:   Mertie Moores, MD

## 2023-01-10 ENCOUNTER — Other Ambulatory Visit: Payer: Self-pay | Admitting: Family Medicine

## 2023-01-10 DIAGNOSIS — E039 Hypothyroidism, unspecified: Secondary | ICD-10-CM

## 2023-01-22 ENCOUNTER — Ambulatory Visit: Payer: PPO | Admitting: Family Medicine

## 2023-01-30 ENCOUNTER — Encounter: Payer: Self-pay | Admitting: Family Medicine

## 2023-01-30 ENCOUNTER — Ambulatory Visit (INDEPENDENT_AMBULATORY_CARE_PROVIDER_SITE_OTHER): Payer: PPO | Admitting: Family Medicine

## 2023-01-30 VITALS — BP 135/77 | HR 57 | Resp 18 | Ht 68.0 in | Wt 188.0 lb

## 2023-01-30 DIAGNOSIS — Z23 Encounter for immunization: Secondary | ICD-10-CM

## 2023-01-30 DIAGNOSIS — E039 Hypothyroidism, unspecified: Secondary | ICD-10-CM

## 2023-01-30 DIAGNOSIS — E785 Hyperlipidemia, unspecified: Secondary | ICD-10-CM

## 2023-01-30 NOTE — Assessment & Plan Note (Signed)
Last TSH within normal limits.  Rechecking TSH within the next week.  Continue levothyroxine 112 mcg daily unless lab results warrant change in therapy.  After 42-month follow-up if stable, will follow-up annually.

## 2023-01-30 NOTE — Assessment & Plan Note (Signed)
Followed and managed by cardiology.  Last lipid panel 07/25/2022: LDL 105, HDL 83, triglycerides 73.  Continue rosuvastatin 10 mg daily and annual follow-up with cardiology.

## 2023-01-30 NOTE — Progress Notes (Signed)
Established Patient Office Visit  Subjective   Patient ID: Joshua Moreno, male    DOB: 1951/05/06  Age: 72 y.o. MRN: [id number]  No chief complaint on file.   HPI Joshua Moreno is a 72 y.o. male presenting today for follow up of hypothyroidism, hyperlipidemia. Hypothyroidism: Taking levothyroxine 112 mcg regularly in the AM away from food and vitamins. Denies fatigue, weight changes, heat/cold intolerance, skin/hair changes, bowel changes, CVS symptoms. Hyperlipidemia: Followed and managed by cardiology.  Tolerating rosuvastatin well with no myalgias or significant side effects.  Plays tennis several times each week. The 10-year ASCVD risk score (Arnett DK, et al., 2019) is: 19.9%  Outpatient Medications Prior to Visit  Medication Sig   Ascorbic Acid (VITAMIN C) 100 MG tablet Take 200 mg by mouth daily.   fluorouracil (EFUDEX) 5 % cream Apply topically.   IBU 400 MG tablet Take 400 mg by mouth every 4 (four) hours as needed.   levothyroxine (SYNTHROID) 112 MCG tablet TAKE 1 TABLET (112 MCG TOTAL) BY MOUTH DAILY.   potassium chloride (KLOR-CON) 10 MEQ tablet Take 1 tablet (10 mEq total) by mouth 2 (two) times daily.   pyridOXINE (VITAMIN B-6) 100 MG tablet Take 100 mg by mouth daily.   rosuvastatin (CRESTOR) 10 MG tablet Take 1 tablet (10 mg total) by mouth daily.   No facility-administered medications prior to visit.    ROS Negative unless otherwise noted in HPI   Objective:     BP 135/77 (BP Location: Left Arm, Patient Position: Sitting, Cuff Size: Normal)   Pulse (!) 57   Resp 18   Ht 5\' 8"  (1.727 m)   Wt 188 lb (85.3 kg)   SpO2 95%   BMI 28.59 kg/m   Physical Exam Constitutional:      General: He is not in acute distress.    Appearance: Normal appearance.  HENT:     Head: Normocephalic and atraumatic.  Cardiovascular:     Rate and Rhythm: Normal rate and regular rhythm.     Heart sounds: Normal heart sounds. No murmur heard.    No friction rub. No gallop.   Pulmonary:     Effort: Pulmonary effort is normal. No respiratory distress.     Breath sounds: Normal breath sounds. No wheezing, rhonchi or rales.  Skin:    General: Skin is warm and dry.  Neurological:     Mental Status: He is alert and oriented to person, place, and time.  Psychiatric:        Mood and Affect: Mood normal.     Assessment & Plan:  Hypothyroidism, unspecified type Assessment & Plan: Last TSH within normal limits.  Rechecking TSH within the next week.  Continue levothyroxine 112 mcg daily unless lab results warrant change in therapy.  After 36-month follow-up if stable, will follow-up annually.  Orders: -     TSH Rfx on Abnormal to Free T4; Future  Hyperlipidemia, unspecified hyperlipidemia type Assessment & Plan: Followed and managed by cardiology.  Last lipid panel 07/25/2022: LDL 105, HDL 83, triglycerides 73.  Continue rosuvastatin 10 mg daily and annual follow-up with cardiology.  Orders: -     Comprehensive metabolic panel; Future -     Lipid panel; Future  Need for influenza vaccination -     Flu Vaccine Trivalent High Dose (Fluad)    Return in about 6 months (around 07/30/2023) for follow-up for HLD and thyroid, fasting blood work 1 week before.    Melida Quitter, PA

## 2023-04-12 ENCOUNTER — Other Ambulatory Visit: Payer: Self-pay | Admitting: Family Medicine

## 2023-04-12 DIAGNOSIS — E039 Hypothyroidism, unspecified: Secondary | ICD-10-CM

## 2023-06-05 DIAGNOSIS — D692 Other nonthrombocytopenic purpura: Secondary | ICD-10-CM | POA: Diagnosis not present

## 2023-06-05 DIAGNOSIS — D0462 Carcinoma in situ of skin of left upper limb, including shoulder: Secondary | ICD-10-CM | POA: Diagnosis not present

## 2023-06-05 DIAGNOSIS — Z85828 Personal history of other malignant neoplasm of skin: Secondary | ICD-10-CM | POA: Diagnosis not present

## 2023-06-05 DIAGNOSIS — L814 Other melanin hyperpigmentation: Secondary | ICD-10-CM | POA: Diagnosis not present

## 2023-06-05 DIAGNOSIS — D1801 Hemangioma of skin and subcutaneous tissue: Secondary | ICD-10-CM | POA: Diagnosis not present

## 2023-06-05 DIAGNOSIS — L821 Other seborrheic keratosis: Secondary | ICD-10-CM | POA: Diagnosis not present

## 2023-06-05 DIAGNOSIS — L570 Actinic keratosis: Secondary | ICD-10-CM | POA: Diagnosis not present

## 2023-06-05 DIAGNOSIS — D485 Neoplasm of uncertain behavior of skin: Secondary | ICD-10-CM | POA: Diagnosis not present

## 2023-06-14 ENCOUNTER — Encounter: Payer: Self-pay | Admitting: Family Medicine

## 2023-07-13 ENCOUNTER — Other Ambulatory Visit: Payer: Self-pay | Admitting: Family Medicine

## 2023-07-13 DIAGNOSIS — E039 Hypothyroidism, unspecified: Secondary | ICD-10-CM

## 2023-07-14 ENCOUNTER — Other Ambulatory Visit: Payer: Self-pay | Admitting: Family Medicine

## 2023-07-14 DIAGNOSIS — E039 Hypothyroidism, unspecified: Secondary | ICD-10-CM

## 2023-07-16 ENCOUNTER — Other Ambulatory Visit: Payer: Self-pay | Admitting: Family Medicine

## 2023-07-16 DIAGNOSIS — E039 Hypothyroidism, unspecified: Secondary | ICD-10-CM

## 2023-07-16 MED ORDER — LEVOTHYROXINE SODIUM 112 MCG PO TABS: 112.0000 ug | ORAL_TABLET | Freq: Every day | ORAL | 1 refills | Status: DC

## 2023-07-23 ENCOUNTER — Ambulatory Visit: Payer: PPO | Admitting: Family Medicine

## 2023-07-26 ENCOUNTER — Telehealth: Payer: Self-pay | Admitting: *Deleted

## 2023-07-26 ENCOUNTER — Ambulatory Visit (INDEPENDENT_AMBULATORY_CARE_PROVIDER_SITE_OTHER): Payer: PPO

## 2023-07-26 DIAGNOSIS — Z0000 Encounter for general adult medical examination without abnormal findings: Secondary | ICD-10-CM | POA: Diagnosis not present

## 2023-07-26 NOTE — Addendum Note (Signed)
 Addended by: Barb Merino on: 07/26/2023 04:06 PM   Modules accepted: Level of Service

## 2023-07-26 NOTE — Progress Notes (Signed)
 Subjective:   Joshua Moreno is a 73 y.o. who presents for a Medicare Wellness preventive visit.  Visit Complete: Virtual I connected with  Joshua Moreno on 07/26/23 by a audio enabled telemedicine application and verified that I am speaking with the correct person using two identifiers.  Patient Location: Home  Provider Location: Home Office  I discussed the limitations of evaluation and management by telemedicine. The patient expressed understanding and agreed to proceed.  Vital Signs: Because this visit was a virtual/telehealth visit, some criteria may be missing or patient reported. Any vitals not documented were not able to be obtained and vitals that have been documented are patient reported.  VideoError- Librarian, academic were attempted between this provider and patient, however failed, due to patient having technical difficulties OR patient did not have access to video capability.  We continued and completed visit with audio only.   Persons Participating in Visit: Patient.  AWV Questionnaire: No: Patient Medicare AWV questionnaire was not completed prior to this visit.  Cardiac Risk Factors include: advanced age (>62men, >66 women);dyslipidemia;male gender     Objective:    Today's Vitals   There is no height or weight on file to calculate BMI.     07/26/2023    3:02 PM 06/05/2019    8:10 AM 03/13/2018    2:16 PM 02/13/2017    8:34 AM 01/11/2016    8:29 AM 03/29/2015   10:37 AM 01/06/2015   10:52 AM  Advanced Directives  Does Patient Have a Medical Advance Directive? Yes No Yes Yes Yes Yes Yes  Type of Estate agent of [place];Living will   Healthcare Power of [place];Living will Healthcare Power of [place];Living will Healthcare Power of [place];Living will   Does patient want to make changes to medical advance directive?   No - Patient declined   No - Patient declined   Copy of Healthcare Power of Attorney in Chart?  Yes - validated most recent copy scanned in chart (See row information)   No - copy requested Yes No - copy requested No - copy requested  Would patient like information on creating a medical advance directive?  No - Patient declined         Current Medications (verified) Outpatient Encounter Medications as of 07/26/2023  Medication Sig   Ascorbic Acid (VITAMIN C) 100 MG tablet Take 200 mg by mouth daily.   Calcium Carbonate (CALCIUM 600 PO) Take 1 tablet by mouth daily.   IBU 400 MG tablet Take 400 mg by mouth every 4 (four) hours as needed.   levothyroxine (SYNTHROID) 112 MCG tablet Take 1 tablet (112 mcg total) by mouth daily.   potassium chloride (KLOR-CON) 10 MEQ tablet Take 1 tablet (10 mEq total) by mouth 2 (two) times daily.   pyridOXINE (VITAMIN B-6) 100 MG tablet Take 100 mg by mouth daily.   rosuvastatin (CRESTOR) 10 MG tablet Take 1 tablet (10 mg total) by mouth daily.   fluorouracil (EFUDEX) 5 % cream Apply topically. (Patient not taking: Reported on 07/26/2023)   No facility-administered encounter medications on file as of 07/26/2023.    Allergies (verified) Patient has no known allergies.   History: Past Medical History:  Diagnosis Date   Hyperlipidemia    Personal history of colonic polyps-adenoma 06/26/2008   Skin cancer of face    Actinic keratoses; followed annually by [facility]   Thyroid disease    Past Surgical History:  Procedure Laterality Date   COLONOSCOPY  HYDROCELE EXCISION / REPAIR     R scrotum   POLYPECTOMY     tail bone     Family History  Problem Relation Age of Onset   Heart disease Father 70       CABG/CAD   Alzheimer's disease Mother    Colon cancer Neg Hx    Esophageal cancer Neg Hx    Rectal cancer Neg Hx    Stomach cancer Neg Hx    Colon polyps Neg Hx    Social History   Socioeconomic History   Marital status: Widowed    Spouse name: Not on file   Number of children: Not on file   Years of education: Not on file    Highest education level: Not on file  Occupational History   Occupation: retired  Tobacco Use   Smoking status: Former    Current packs/day: 0.00    Average packs/day: 1 pack/day for 35.0 years (35.0 ttl pk-yrs)    Types: Cigarettes    Start date: 12/06/1968    Quit date: 12/07/2003    Years since quitting: 19.6    Passive exposure: Never   Smokeless tobacco: Never  Vaping Use   Vaping status: Never Used  Substance and Sexual Activity   Alcohol use: Yes    Alcohol/week: 16.0 standard drinks of alcohol    Types: 4 Cans of beer, 6 Shots of liquor, 6 Standard drinks or equivalent per week   Drug use: No   Sexual activity: Yes    Birth control/protection: None  Other Topics Concern   Not on file  Social History Narrative   Marital status: married x 37 years widowed 12/2018 (wife had melanoma)      Children:  3 children; 3 grandchildren; no gg      Lives: with wife      Employment:  Airline pilot truck tires x 40 years; 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      Tobacco: quit in 2000.  1 ppd x 30 years.      Alcohol:  Beer or bourbon daily; 5-8 drinks per week.        Exercise:  Plays tennis four days weekly. [employer].      ADLs: independent. Drives.      Advanced Directives: FULL CODE; no prolonged resuscitation.        Seatbelt: 100%; no texting while driving.           Social Drivers of Corporate investment banker Strain: Low Risk  (07/26/2023)   Overall Financial Resource Strain (CARDIA)    Difficulty of Paying Living Expenses: Not hard at all  Food Insecurity: No Food Insecurity (07/26/2023)   Hunger Vital Sign    Worried About Running Out of Food in the Last Year: Never true    Ran Out of Food in the Last Year: Never true  Transportation Needs: No Transportation Needs (07/26/2023)   PRAPARE - Administrator, Civil Service (Medical): No    Lack of Transportation (Non-Medical): No  Physical Activity: Sufficiently Active (07/26/2023)   Exercise Vital Sign    Days of Exercise per  Week: 6 days    Minutes of Exercise per Session: 120 min  Stress: No Stress Concern Present (07/26/2023)   [employer] of Occupational Health - Occupational Stress Questionnaire    Feeling of Stress : Not at all  Social Connections: Moderately Integrated (07/26/2023)   Social Connection and Isolation Panel [NHANES]    Frequency of Communication with Friends and Family:  Twice a week    Frequency of Social Gatherings with Friends and Family: More than three times a week    Attends Religious Services: More than 4 times per year    Active Member of Clubs or Organizations: Yes    Attends Banker Meetings: More than 4 times per year    Marital Status: Widowed    Tobacco Counseling Counseling given: Not Answered    Clinical Intake:  Pre-visit preparation completed: Yes  Pain : No/denies pain     Nutritional Risks: None Diabetes: No  Lab Results  Component Value Date   HGBA1C 5.6 07/25/2022   HGBA1C 5.5 07/05/2021   HGBA1C 5.5 05/24/2021     How often do you need to have someone help you when you read instructions, pamphlets, or other written materials from your doctor or pharmacy?: 1 - Never  Interpreter Needed?: No  Information entered by :: NAllen LPN   Activities of Daily Living     07/26/2023    2:56 PM  In your present state of health, do you have any difficulty performing the following activities:  Hearing? 0  Vision? 0  Difficulty concentrating or making decisions? 0  Walking or climbing stairs? 0  Dressing or bathing? 0  Doing errands, shopping? 0  Preparing Food and eating ? N  Using the Toilet? N  In the past six months, have you accidently leaked urine? N  Do you have problems with loss of bowel control? N  Managing your Medications? N  Managing your Finances? N  Housekeeping or managing your Housekeeping? N    Patient Care Team: Melida Quitter, PA as PCP - General (Family Medicine) Nahser, Deloris Ping, MD as PCP - Cardiology  (Cardiology) [place], Heather, OD (Optometry) Aris Lot, MD as Consulting Physician (Dermatology)  Indicate any recent Medical Services you may have received from other than [facility] providers in the past year (date may be approximate).     Assessment:   This is a routine wellness examination for Joshua Moreno.  Hearing/Vision screen Hearing Screening - Comments:: Denies hearing issues Vision Screening - Comments:: Regular eye exams, Dr. Heather [place]   Goals Addressed             This Visit's Progress    Patient Stated       07/26/2023, wants to lose 4-5 pounds       Depression Screen     07/26/2023    3:03 PM 07/19/2022    1:27 PM 06/06/2021    3:27 PM 05/26/2020    1:46 PM 11/19/2019   11:17 AM 05/20/2019    8:16 AM 04/17/2018    3:00 PM  PHQ 2/9 Scores  PHQ - 2 Score 0 0 0 0 0 0 0  PHQ- 9 Score 0  0 0 0 1 0    Fall Risk     07/26/2023    3:02 PM 07/19/2022    1:33 PM 07/15/2022    4:40 PM 06/06/2021    3:26 PM 05/26/2020    1:46 PM  Fall Risk   Falls in the past year? 1 1 1 1  0  Comment playing tennis      Number falls in past yr: 0 0 0 0   Injury with Fall? 0 1 1 0   Risk for fall due to : Medication side effect   No Fall Risks   Follow up Falls prevention discussed;Falls evaluation completed   Falls evaluation completed Falls evaluation completed  MEDICARE RISK AT HOME:  Medicare Risk at Home Any stairs in or around the home?: Yes If so, are there any without handrails?: No Home free of loose throw rugs in walkways, pet beds, electrical cords, etc?: Yes Adequate lighting in your home to reduce risk of falls?: Yes Life alert?: No Use of a cane, walker or w/c?: No Grab bars in the bathroom?: Yes Shower chair or bench in shower?: Yes Elevated toilet seat or a handicapped toilet?: Yes  TIMED UP AND GO:  Was the test performed?  No  Cognitive Function: 6CIT completed        07/26/2023    3:05 PM 07/19/2022    1:27 PM 06/06/2021    3:18 PM 05/26/2020     1:46 PM 05/20/2019    8:15 AM  6CIT Screen  What Year? 0 points 0 points 0 points 0 points 0 points  What month? 0 points 0 points 0 points 0 points 0 points  What time? 0 points  0 points 0 points 0 points  Count back from 20 0 points 0 points 0 points 0 points 0 points  Months in reverse 0 points 0 points 0 points 0 points 0 points  Repeat phrase 0 points 0 points 0 points 0 points 0 points  Total Score 0 points  0 points 0 points 0 points    Immunizations Immunization History  Administered Date(s) Administered   Fluad Trivalent(High Dose 65+) 01/30/2023   Influenza Split 05/26/2009   Influenza, High Dose Seasonal PF 04/17/2018, 02/14/2022   Influenza,inj,Quad PF,6+ Mos 01/11/2016, 02/13/2017   Influenza-Unspecified 02/14/2019, 02/21/2021   PFIZER(Purple Top)SARS-COV-2 Vaccination 06/30/2019, 07/21/2019, 04/29/2020   Pneumococcal Conjugate-13 01/06/2015   Pneumococcal Polysaccharide-23 05/26/2009, 02/13/2017   Tdap 11/30/2011, 05/14/2018   Zoster Recombinant(Shingrix) 06/24/2020, 12/20/2020   Zoster, Live 05/08/2009    Screening Tests Health Maintenance  Topic Date Due   COVID-19 Vaccine (4 - 2024-25 season) 01/07/2023   Medicare Annual Wellness (AWV)  07/25/2024   Colonoscopy  05/04/2027   DTaP/Tdap/Td (3 - Td or Tdap) 05/14/2028   Pneumonia Vaccine 48+ Years old  Completed   INFLUENZA VACCINE  Completed   Hepatitis C Screening  Completed   Zoster Vaccines- Shingrix  Completed   HPV VACCINES  Aged Out    Health Maintenance  Health Maintenance Due  Topic Date Due   COVID-19 Vaccine (4 - 2024-25 season) 01/07/2023   Health Maintenance Items Addressed: Declines covid vaccine.  Additional Screening:  Vision Screening: Recommended annual ophthalmology exams for early detection of glaucoma and other disorders of the eye.  Dental Screening: Recommended annual dental exams for proper oral hygiene  Community Resource Referral / Chronic Care Management: CRR required  this visit?  No   CCM required this visit?  No     Plan:     I have personally reviewed and noted the following in the patient's chart:   Medical and social history Use of alcohol, tobacco or illicit drugs  Current medications and supplements including opioid prescriptions. Patient is not currently taking opioid prescriptions. Functional ability and status Nutritional status Physical activity Advanced directives List of other physicians Hospitalizations, surgeries, and ER visits in previous 12 months Vitals Screenings to include cognitive, depression, and falls Referrals and appointments  In addition, I have reviewed and discussed with patient certain preventive protocols, quality metrics, and best practice recommendations. A written personalized care plan for preventive services as well as general preventive health recommendations were provided to patient.     Herbie Saxon  Clabe Seal, LPN   1/61/0960   After Visit Summary: (MyChart) Due to this being a telephonic visit, the after visit summary with patients personalized plan was offered to patient via MyChart   Notes: Nothing significant to report at this time.

## 2023-07-26 NOTE — Patient Instructions (Signed)
 Mr. Strupp , Thank you for taking time to come for your Medicare Wellness Visit. I appreciate your ongoing commitment to your health goals. Please review the following plan we discussed and let me know if I can assist you in the future.   Referrals/Orders/Follow-Ups/Clinician Recommendations: none  This is a list of the screening recommended for you and due dates:  Health Maintenance  Topic Date Due   COVID-19 Vaccine (4 - 2024-25 season) 01/07/2023   Medicare Annual Wellness Visit  07/25/2024   Colon Cancer Screening  05/04/2027   DTaP/Tdap/Td vaccine (3 - Td or Tdap) 05/14/2028   Pneumonia Vaccine  Completed   Flu Shot  Completed   Hepatitis C Screening  Completed   Zoster (Shingles) Vaccine  Completed   HPV Vaccine  Aged Out    Advanced directives: (In Chart) A copy of your advanced directives are scanned into your chart should your provider ever need it.  Next Medicare Annual Wellness Visit scheduled for next year: Yes  insert Preventive Care attachment Insert FALL PREVENTION attachment if needed

## 2023-07-26 NOTE — Telephone Encounter (Signed)
 Contacted pt and scheduled him a lab visit.

## 2023-07-26 NOTE — Telephone Encounter (Signed)
 Copied from CRM [id number]. Topic: General - Other >> Jul 26, 2023  4:36 PM [place] B wrote: Reason for CRM: pt called in about when exactly he is gonna have blood work before his physical on /

## 2023-08-30 ENCOUNTER — Other Ambulatory Visit

## 2023-08-30 DIAGNOSIS — E785 Hyperlipidemia, unspecified: Secondary | ICD-10-CM

## 2023-08-30 DIAGNOSIS — E039 Hypothyroidism, unspecified: Secondary | ICD-10-CM

## 2023-09-05 ENCOUNTER — Other Ambulatory Visit

## 2023-09-05 DIAGNOSIS — E039 Hypothyroidism, unspecified: Secondary | ICD-10-CM | POA: Diagnosis not present

## 2023-09-05 DIAGNOSIS — E785 Hyperlipidemia, unspecified: Secondary | ICD-10-CM | POA: Diagnosis not present

## 2023-09-06 ENCOUNTER — Encounter: Payer: Self-pay | Admitting: Family Medicine

## 2023-09-06 ENCOUNTER — Ambulatory Visit (INDEPENDENT_AMBULATORY_CARE_PROVIDER_SITE_OTHER): Admitting: Family Medicine

## 2023-09-06 VITALS — BP 135/81 | HR 73 | Ht 68.0 in | Wt 186.4 lb

## 2023-09-06 DIAGNOSIS — E039 Hypothyroidism, unspecified: Secondary | ICD-10-CM | POA: Diagnosis not present

## 2023-09-06 DIAGNOSIS — E785 Hyperlipidemia, unspecified: Secondary | ICD-10-CM

## 2023-09-06 DIAGNOSIS — Z0000 Encounter for general adult medical examination without abnormal findings: Secondary | ICD-10-CM | POA: Diagnosis not present

## 2023-09-06 LAB — COMPREHENSIVE METABOLIC PANEL WITH GFR
ALT: 36 IU/L (ref 0–44)
AST: 34 IU/L (ref 0–40)
Albumin: 4.2 g/dL (ref 3.8–4.8)
Alkaline Phosphatase: 89 IU/L (ref 44–121)
BUN/Creatinine Ratio: 16 (ref 10–24)
BUN: 18 mg/dL (ref 8–27)
Bilirubin Total: 0.7 mg/dL (ref 0.0–1.2)
CO2: 22 mmol/L (ref 20–29)
Calcium: 9 mg/dL (ref 8.6–10.2)
Chloride: 105 mmol/L (ref 96–106)
Creatinine, Ser: 1.1 mg/dL (ref 0.76–1.27)
Globulin, Total: 1.9 g/dL (ref 1.5–4.5)
Glucose: 91 mg/dL (ref 70–99)
Potassium: 4.6 mmol/L (ref 3.5–5.2)
Sodium: 142 mmol/L (ref 134–144)
Total Protein: 6.1 g/dL (ref 6.0–8.5)
eGFR: 70 mL/min/{1.73_m2} (ref >59)

## 2023-09-06 LAB — LIPID PANEL
Chol/HDL Ratio: 2.4 ratio (ref 0.0–5.0)
Cholesterol, Total: 158 mg/dL (ref 100–199)
HDL: 66 mg/dL (ref >39)
LDL Chol Calc (NIH): 77 mg/dL (ref 0–99)
Triglycerides: 77 mg/dL (ref 0–149)
VLDL Cholesterol Cal: 15 mg/dL (ref 5–40)

## 2023-09-06 LAB — TSH RFX ON ABNORMAL TO FREE T4: TSH: 2.77 u[IU]/mL (ref 0.450–4.500)

## 2023-09-06 NOTE — Patient Instructions (Signed)
 It was nice to see you today,  We addressed the following topics today: -Your blood pressure was good today - Your cholesterol levels are normal and your thyroid  labs are normal.  No changes to any of your medications - We will see back in 1 year unless you need to see us  sooner.  Have a great day,  Etha Henle, MD

## 2023-09-06 NOTE — Assessment & Plan Note (Signed)
 TSH at goal.  Continue current dosing, 112 mcg levothyroxine 

## 2023-09-06 NOTE — Assessment & Plan Note (Signed)
 LDL at goal.  Continue 10 mg Crestor .  Check at next year's physical.

## 2023-09-06 NOTE — Progress Notes (Signed)
   Established Patient Office Visit  Subjective   Patient ID: Joshua Moreno, male    DOB: 1951/02/20  Age: 73 y.o. MRN: [id number]  Chief Complaint  Patient presents with   Medical Management of Chronic Issues    HPI Subjective: - Annual physical exam - Reports BP was high last year (140s), cardiologist prescribed medications including potassium - Discontinued BP medications and reduced salt intake instead; BP now in 130s/120s - No acute concerns - Reports sleeping well, falls asleep within 5 minutes, sometimes sleeps through night - Tennis 6 days/week, 2 hours daily - Last colonoscopy last year, likely final one due to age    The 10-year ASCVD risk score (Arnett DK, et al., 2019) is: 21.3%  Health Maintenance Due  Topic Date Due   COVID-19 Vaccine (4 - 2024-25 season) 01/07/2023      Objective:     BP 135/81   Pulse 73   Ht 5\' 8"  (1.727 m)   Wt 186 lb 6.4 oz (84.6 kg)   SpO2 97%   BMI 28.34 kg/m    Physical Exam General: Alert, oriented CV: Rate rhythm no murmurs Pulmonary: Lungs clear bilaterally no wheeze or crackles Skin: Warm and dry, scattered areas of sun damage Psych: Pleasant affect   No results found for any visits on 09/06/23.      Assessment & Plan:   Physical exam, annual  Hypothyroidism, unspecified type Assessment & Plan: TSH at goal.  Continue current dosing, 112 mcg levothyroxine    Hyperlipidemia, unspecified hyperlipidemia type Assessment & Plan: LDL at goal.  Continue 10 mg Crestor .  Check at next year's physical.      Return in about 1 year (around 09/05/2024) for physical.    Laneta Pintos, MD

## 2023-09-10 ENCOUNTER — Encounter: Payer: Self-pay | Admitting: Family Medicine

## 2023-12-12 ENCOUNTER — Other Ambulatory Visit: Payer: Self-pay | Admitting: Cardiovascular Disease

## 2023-12-12 MED ORDER — ROSUVASTATIN CALCIUM 10 MG PO TABS: 10.0000 mg | ORAL_TABLET | Freq: Every day | ORAL | 0 refills | Status: DC

## 2023-12-31 ENCOUNTER — Encounter: Payer: Self-pay | Admitting: Cardiovascular Disease

## 2023-12-31 ENCOUNTER — Ambulatory Visit (HOSPITAL_COMMUNITY)
Admission: RE | Admit: 2023-12-31 | Discharge: 2023-12-31 | Disposition: A | Discharge status: Home / Self Care | Payer: Self-pay | Source: Ambulatory Visit | Attending: Cardiovascular Disease | Admitting: Cardiovascular Disease

## 2023-12-31 ENCOUNTER — Ambulatory Visit: Attending: Cardiovascular Disease | Admitting: Cardiovascular Disease

## 2023-12-31 VITALS — BP 132/78 | HR 75 | Ht 68.0 in | Wt 189.0 lb

## 2023-12-31 DIAGNOSIS — Z87891 Personal history of nicotine dependence: Secondary | ICD-10-CM

## 2023-12-31 DIAGNOSIS — E782 Mixed hyperlipidemia: Secondary | ICD-10-CM

## 2023-12-31 DIAGNOSIS — I4510 Unspecified right bundle-branch block: Secondary | ICD-10-CM

## 2023-12-31 DIAGNOSIS — I10 Essential (primary) hypertension: Secondary | ICD-10-CM | POA: Diagnosis not present

## 2023-12-31 DIAGNOSIS — I700 Atherosclerosis of aorta: Secondary | ICD-10-CM | POA: Insufficient documentation

## 2023-12-31 NOTE — Patient Instructions (Signed)
 Medication Instructions:  Your physician recommends that you continue on your current medications as directed. Please refer to the Current Medication list given to you today.  *If you need a refill on your cardiac medications before your next appointment, please call your pharmacy*   Testing/Procedures: Dr. Court has ordered a CT coronary calcium  score.   Test locations:  [facility] at [facility]  [facility] at [facility]  This is $99 out of pocket.   Coronary CalciumScan A coronary calcium  scan is an imaging test used to look for deposits of calcium  and other fatty materials (plaques) in the inner lining of the blood vessels of the heart (coronary arteries). These deposits of calcium  and plaques can partly clog and narrow the coronary arteries without producing any symptoms or warning signs. This puts a person at risk for a heart attack. This test can detect these deposits before symptoms develop. Tell a health care provider about: Any allergies you have. All medicines you are taking, including vitamins, herbs, eye drops, creams, and over-the-counter medicines. Any problems you or family members have had with anesthetic medicines. Any blood disorders you have. Any surgeries you have had. Any medical conditions you have. Whether you are pregnant or may be pregnant. What are the risks? Generally, this is a safe procedure. However, problems may occur, including: Harm to a pregnant woman and her unborn baby. This test involves the use of radiation. Radiation exposure can be dangerous to a pregnant woman and her unborn baby. If you are pregnant, you generally should not have this procedure done. Slight increase in the risk of cancer. This is because of the radiation involved in the test. What happens before the procedure? No preparation is needed for this procedure. What happens  during the procedure? You will undress and remove any jewelry around your neck or chest. You will put on a hospital gown. Sticky electrodes will be placed on your chest. The electrodes will be connected to an electrocardiogram (ECG) machine to record a tracing of the electrical activity of your heart. A CT scanner will take pictures of your heart. During this time, you will be asked to lie still and hold your breath for 2-3 seconds while a picture of your heart is being taken. The procedure may vary among health care providers and hospitals. What happens after the procedure? You can get dressed. You can return to your normal activities. It is up to you to get the results of your test. Ask your health care provider, or the department that is doing the test, when your results will be ready. Summary A coronary calcium  scan is an imaging test used to look for deposits of calcium  and other fatty materials (plaques) in the inner lining of the blood vessels of the heart (coronary arteries). Generally, this is a safe procedure. Tell your health care provider if you are pregnant or may be pregnant. No preparation is needed for this procedure. A CT scanner will take pictures of your heart. You can return to your normal activities after the scan is done. This information is not intended to replace advice given to you by your health care provider. Make sure you discuss any questions you have with your health care provider. Document Released: 10/21/2007 Document Revised: 03/13/2016 Document Reviewed: 03/13/2016 Elsevier Interactive Patient Education  2017 [employer].   Follow-Up: At [facility], you and your health needs are our priority.  As part of our continuing mission to provide you with exceptional heart care, our providers are all part of one team.  This team includes your primary Cardiologist (physician) and Advanced Practice Providers or APPs (Physician Assistants and Nurse  Practitioners) who all work together to provide you with the care you need, when you need it.  Your next appointment:   12 month(s)  Provider:   Dorn Lesches, MD    We recommend signing up for the patient portal called MyChart.  Sign up information is provided on this After Visit Summary.  MyChart is used to connect with patients for Virtual Visits (Telemedicine).  Patients are able to view lab/test results, encounter notes, upcoming appointments, etc.  Non-urgent messages can be sent to your provider as well.   To learn more about what you can do with MyChart, go to [email or web address].

## 2023-12-31 NOTE — Assessment & Plan Note (Signed)
 Chronic

## 2023-12-31 NOTE — Assessment & Plan Note (Signed)
 50-pack-year tobacco abuse having quit 25 years ago.

## 2023-12-31 NOTE — Progress Notes (Signed)
 12/31/2023 Joshua Moreno   Nov 03, 1950  [id number]  Primary Physician Chandra Toribio POUR, MD Primary Cardiologist: Dorn JINNY Lesches MD [facility], [place], [place]  HPI:  Joshua Moreno is a 73 y.o. fit-appearing widowed Caucasian male (wife died in 2019-01-11), father of 3, grandfather 4 grandchildren formally a patient of Dr. Linton.  I am assuming his care.  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  His risk factors include tobacco abuse having smoked 30 pack years and quit 25 years ago.  He does have treated hyperlipidemia and chronic right bundle branch block.  His father had CABG in his 27s.  He is never had a heart attack or stroke.  He denies chest pain or shortness of breath.  He is fairly active and plays tennis and does yard work.  He apparently has had coronary calcifications noted in the past.  Current Meds  Medication Sig   Ascorbic Acid (VITAMIN C) 100 MG tablet Take 200 mg by mouth daily.   Calcium  Carbonate (CALCIUM  600 PO) Take 1 tablet by mouth daily.   levothyroxine  (SYNTHROID ) 112 MCG tablet Take 1 tablet (112 mcg total) by mouth daily.   pyridOXINE (VITAMIN B-6) 100 MG tablet Take 100 mg by mouth daily.   rosuvastatin  (CRESTOR ) 10 MG tablet Take 1 tablet (10 mg total) by mouth daily.     No Known Allergies  Social History   Socioeconomic History   Marital status: Widowed    Spouse name: Not on file   Number of children: Not on file   Years of education: Not on file   Highest education level: Bachelor's degree (e.g., BA, AB, BS)  Occupational History   Occupation: retired  Tobacco Use   Smoking status: Former    Current packs/day: 0.00    Average packs/day: 1 pack/day for 35.0 years (35.0 ttl pk-yrs)    Types: Cigarettes    Start date: 12/06/1968    Quit date: 12/07/2003    Years since quitting: 20.0    Passive exposure: Never   Smokeless tobacco: Never  Vaping Use   Vaping status: Never Used  Substance  and Sexual Activity   Alcohol use: Yes    Alcohol/week: 16.0 standard drinks of alcohol    Types: 4 Cans of beer, 6 Shots of liquor, 6 Standard drinks or equivalent per week   Drug use: No   Sexual activity: Yes    Birth control/protection: None  Other Topics Concern   Not on file  Social History Narrative   Marital status: married x 37 years widowed 12/2018 (wife had melanoma)      Children:  3 children; 3 grandchildren; no gg      Lives: with wife      Employment:  Airline pilot truck tires x 40 years; 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      Tobacco: quit in Jan 11, 1999.  1 ppd x 30 years.      Alcohol:  Beer or bourbon daily; 5-8 drinks per week.        Exercise:  Plays tennis four days weekly. [employer].      ADLs: independent. Drives.      Advanced Directives: FULL CODE; no prolonged resuscitation.        Seatbelt: 100%; no texting while driving.           Social Drivers of Corporate investment banker Strain: Low Risk  (09/04/2023)   Overall Physicist, medical  Strain (CARDIA)    Difficulty of Paying Living Expenses: Not hard at all  Food Insecurity: No Food Insecurity (09/04/2023)   Hunger Vital Sign    Worried About Running Out of Food in the Last Year: Never true    Ran Out of Food in the Last Year: Never true  Transportation Needs: No Transportation Needs (09/04/2023)   PRAPARE - Administrator, Civil Service (Medical): No    Lack of Transportation (Non-Medical): No  Physical Activity: Sufficiently Active (09/04/2023)   Exercise Vital Sign    Days of Exercise per Week: 6 days    Minutes of Exercise per Session: 120 min  Stress: No Stress Concern Present (09/04/2023)   [employer] of Occupational Health - Occupational Stress Questionnaire    Feeling of Stress : Not at all  Social Connections: Moderately Integrated (09/04/2023)   Social Connection and Isolation Panel    Frequency of Communication with Friends and Family: More than three times a week    Frequency of Social  Gatherings with Friends and Family: Once a week    Attends Religious Services: More than 4 times per year    Active Member of [employer] or Organizations: Yes    Attends Banker Meetings: More than 4 times per year    Marital Status: Widowed  Intimate Partner Violence: Not At Risk (07/26/2023)   Humiliation, Afraid, Rape, and Kick questionnaire    Fear of Current or Ex-Partner: No    Emotionally Abused: No    Physically Abused: No    Sexually Abused: No     Review of Systems: General: negative for chills, fever, night sweats or weight changes.  Cardiovascular: negative for chest pain, dyspnea on exertion, edema, orthopnea, palpitations, paroxysmal nocturnal dyspnea or shortness of breath Dermatological: negative for rash Respiratory: negative for cough or wheezing Urologic: negative for hematuria Abdominal: negative for nausea, vomiting, diarrhea, bright red blood per rectum, melena, or hematemesis Neurologic: negative for visual changes, syncope, or dizziness All other systems reviewed and are otherwise negative except as noted above.    Blood pressure 132/78, pulse 75, height 5' 8 (1.727 m), weight 189 lb (85.7 kg), SpO2 94%.  General appearance: alert and no distress Neck: no adenopathy, no carotid bruit, no JVD, supple, symmetrical, trachea midline, and thyroid  not enlarged, symmetric, no tenderness/mass/nodules Lungs: clear to auscultation bilaterally Heart: regular rate and rhythm, S1, S2 normal, no murmur, click, rub or gallop Extremities: extremities normal, atraumatic, no cyanosis or edema Pulses: 2+ and symmetric Skin: Skin color, texture, turgor normal. No rashes or lesions Neurologic: Grossly normal  EKG EKG Interpretation Date/Time:  Monday December 31 2023 13:29:57 EDT Ventricular Rate:  75 PR Interval:  170 QRS Duration:  148 QT Interval:  416 QTC Calculation: 464 R Axis:   -31  Text Interpretation: Normal sinus rhythm Left axis deviation Right bundle  branch block When compared with ECG of 18-Dec-2022 16:19, Nonspecific T wave abnormality has replaced inverted T waves in Inferior leads T wave inversion more evident in Anterior leads Confirmed by Court Carrier [phone]) on 12/31/2023 1:35:25 PM    ASSESSMENT AND PLAN:   Hyperlipidemia History of hyperlipidemia on statin therapy with lipid profile performed 09/05/2023 revealing total cholesterol 05[birth year]08/1956, LDL 77 and HDL of 66.  I am going to get a coronary calcium  score to her stratify.  RBBB Chronic  History of tobacco use 50-pack-year tobacco abuse having quit 25 years ago.     Carrier DOROTHA Court MD FACP,FACC,FAHA, [facility] 12/31/2023  1:47 PM

## 2023-12-31 NOTE — Assessment & Plan Note (Signed)
 History of hyperlipidemia on statin therapy with lipid profile performed 09/05/2023 revealing total cholesterol 05/08/1956, LDL 77 and HDL of 66.  I am going to get a coronary calcium  score to her stratify.

## 2024-01-01 ENCOUNTER — Ambulatory Visit: Payer: Self-pay | Admitting: Cardiovascular Disease

## 2024-01-01 DIAGNOSIS — E782 Mixed hyperlipidemia: Secondary | ICD-10-CM

## 2024-01-08 DIAGNOSIS — D0461 Carcinoma in situ of skin of right upper limb, including shoulder: Secondary | ICD-10-CM | POA: Diagnosis not present

## 2024-01-08 DIAGNOSIS — C44722 Squamous cell carcinoma of skin of right lower limb, including hip: Secondary | ICD-10-CM | POA: Diagnosis not present

## 2024-01-16 ENCOUNTER — Other Ambulatory Visit: Payer: Self-pay | Admitting: Family Medicine

## 2024-01-16 DIAGNOSIS — E039 Hypothyroidism, unspecified: Secondary | ICD-10-CM

## 2024-02-01 MED ORDER — ROSUVASTATIN CALCIUM 10 MG PO TABS: 10.0000 mg | ORAL_TABLET | Freq: Every day | ORAL | 0 refills | Status: DC

## 2024-06-11 ENCOUNTER — Other Ambulatory Visit: Payer: Self-pay | Admitting: Cardiovascular Disease

## 2024-08-07 ENCOUNTER — Ambulatory Visit
